# Patient Record
Sex: Male | Born: 1950 | Hispanic: No | Marital: Single | Smoking: Never smoker
Health system: Southern US, Community
[De-identification: ages and names within clinical notes are randomized; demographics above are authoritative.]

## PROBLEM LIST (undated history)

## (undated) DIAGNOSIS — E119 Type 2 diabetes mellitus without complications: Secondary | ICD-10-CM

## (undated) DIAGNOSIS — I10 Essential (primary) hypertension: Secondary | ICD-10-CM

## (undated) HISTORY — PX: KNEE SURGERY: SHX244

---

## 1999-03-24 ENCOUNTER — Encounter: Payer: Self-pay | Admitting: Emergency Medicine

## 1999-03-24 ENCOUNTER — Emergency Department (HOSPITAL_COMMUNITY): Admission: EM | Admit: 1999-03-24 | Discharge: 1999-03-24 | Payer: Self-pay | Admitting: Emergency Medicine

## 2000-07-02 ENCOUNTER — Emergency Department (HOSPITAL_COMMUNITY): Admission: EM | Admit: 2000-07-02 | Discharge: 2000-07-02 | Payer: Self-pay | Admitting: *Deleted

## 2002-04-08 ENCOUNTER — Encounter: Admission: RE | Admit: 2002-04-08 | Discharge: 2002-04-08 | Payer: Self-pay | Admitting: Internal Medicine

## 2002-04-08 ENCOUNTER — Encounter: Payer: Self-pay | Admitting: Internal Medicine

## 2002-04-12 ENCOUNTER — Encounter: Admission: RE | Admit: 2002-04-12 | Discharge: 2002-04-12 | Payer: Self-pay | Admitting: Urology

## 2002-04-12 ENCOUNTER — Encounter: Payer: Self-pay | Admitting: Urology

## 2002-04-21 ENCOUNTER — Encounter: Admission: RE | Admit: 2002-04-21 | Discharge: 2002-04-21 | Payer: Self-pay | Admitting: Urology

## 2002-04-21 ENCOUNTER — Encounter: Payer: Self-pay | Admitting: Urology

## 2002-04-22 ENCOUNTER — Ambulatory Visit (HOSPITAL_BASED_OUTPATIENT_CLINIC_OR_DEPARTMENT_OTHER): Admission: RE | Admit: 2002-04-22 | Discharge: 2002-04-22 | Payer: Self-pay | Admitting: Urology

## 2012-11-18 ENCOUNTER — Ambulatory Visit (HOSPITAL_COMMUNITY)
Admission: RE | Admit: 2012-11-18 | Discharge: 2012-11-18 | Disposition: A | Discharge status: Home / Self Care | Payer: Self-pay | Source: Ambulatory Visit | Attending: Internal Medicine | Admitting: Internal Medicine

## 2012-11-18 ENCOUNTER — Other Ambulatory Visit (HOSPITAL_COMMUNITY): Payer: Self-pay | Admitting: Unknown Physician Specialty

## 2012-11-18 ENCOUNTER — Other Ambulatory Visit (HOSPITAL_COMMUNITY): Payer: Self-pay | Admitting: Internal Medicine

## 2012-11-18 DIAGNOSIS — R609 Edema, unspecified: Secondary | ICD-10-CM

## 2012-11-18 NOTE — Progress Notes (Signed)
*  PRELIMINARY RESULTS* Vascular Ultrasound Left lower extremity venous duplexd has been completed.  Preliminary findings: Left = negative for DVT and baker's cyst.  Bilateral enlarged inguinal lymph nodes noted.  Attempted call report to Dr. Concepcion Elk with no answer. Left voice message with results.   Farrel Demark, RDMS, RVT  11/18/2012, 2:16 PM

## 2017-02-15 ENCOUNTER — Encounter (HOSPITAL_COMMUNITY): Payer: Self-pay | Admitting: Emergency Medicine

## 2017-02-15 ENCOUNTER — Inpatient Hospital Stay (HOSPITAL_COMMUNITY): Payer: Self-pay

## 2017-02-15 ENCOUNTER — Emergency Department (HOSPITAL_COMMUNITY): Payer: Self-pay

## 2017-02-15 ENCOUNTER — Inpatient Hospital Stay (HOSPITAL_COMMUNITY)
Admission: EM | Admit: 2017-02-15 | Discharge: 2017-02-21 | DRG: 194 | Disposition: A | Discharge status: Home / Self Care | Payer: Self-pay | Attending: Family Medicine | Admitting: Family Medicine

## 2017-02-15 ENCOUNTER — Other Ambulatory Visit: Payer: Self-pay

## 2017-02-15 DIAGNOSIS — E119 Type 2 diabetes mellitus without complications: Secondary | ICD-10-CM

## 2017-02-15 DIAGNOSIS — R0609 Other forms of dyspnea: Secondary | ICD-10-CM

## 2017-02-15 DIAGNOSIS — D631 Anemia in chronic kidney disease: Secondary | ICD-10-CM | POA: Diagnosis present

## 2017-02-15 DIAGNOSIS — R7989 Other specified abnormal findings of blood chemistry: Secondary | ICD-10-CM

## 2017-02-15 DIAGNOSIS — R509 Fever, unspecified: Secondary | ICD-10-CM | POA: Diagnosis present

## 2017-02-15 DIAGNOSIS — J189 Pneumonia, unspecified organism: Principal | ICD-10-CM | POA: Diagnosis present

## 2017-02-15 DIAGNOSIS — N179 Acute kidney failure, unspecified: Secondary | ICD-10-CM | POA: Diagnosis present

## 2017-02-15 DIAGNOSIS — E785 Hyperlipidemia, unspecified: Secondary | ICD-10-CM | POA: Diagnosis present

## 2017-02-15 DIAGNOSIS — I358 Other nonrheumatic aortic valve disorders: Secondary | ICD-10-CM | POA: Diagnosis present

## 2017-02-15 DIAGNOSIS — R1013 Epigastric pain: Secondary | ICD-10-CM

## 2017-02-15 DIAGNOSIS — R600 Localized edema: Secondary | ICD-10-CM | POA: Diagnosis present

## 2017-02-15 DIAGNOSIS — I455 Other specified heart block: Secondary | ICD-10-CM | POA: Diagnosis not present

## 2017-02-15 DIAGNOSIS — R0683 Snoring: Secondary | ICD-10-CM | POA: Diagnosis present

## 2017-02-15 DIAGNOSIS — E1122 Type 2 diabetes mellitus with diabetic chronic kidney disease: Secondary | ICD-10-CM | POA: Diagnosis present

## 2017-02-15 DIAGNOSIS — I10 Essential (primary) hypertension: Secondary | ICD-10-CM | POA: Diagnosis present

## 2017-02-15 DIAGNOSIS — N184 Chronic kidney disease, stage 4 (severe): Secondary | ICD-10-CM | POA: Diagnosis present

## 2017-02-15 DIAGNOSIS — J984 Other disorders of lung: Secondary | ICD-10-CM | POA: Diagnosis present

## 2017-02-15 DIAGNOSIS — R109 Unspecified abdominal pain: Secondary | ICD-10-CM | POA: Diagnosis present

## 2017-02-15 DIAGNOSIS — I493 Ventricular premature depolarization: Secondary | ICD-10-CM | POA: Diagnosis not present

## 2017-02-15 DIAGNOSIS — Z7982 Long term (current) use of aspirin: Secondary | ICD-10-CM

## 2017-02-15 DIAGNOSIS — I129 Hypertensive chronic kidney disease with stage 1 through stage 4 chronic kidney disease, or unspecified chronic kidney disease: Secondary | ICD-10-CM | POA: Diagnosis present

## 2017-02-15 DIAGNOSIS — E1121 Type 2 diabetes mellitus with diabetic nephropathy: Secondary | ICD-10-CM

## 2017-02-15 DIAGNOSIS — Z79899 Other long term (current) drug therapy: Secondary | ICD-10-CM

## 2017-02-15 DIAGNOSIS — Z7984 Long term (current) use of oral hypoglycemic drugs: Secondary | ICD-10-CM

## 2017-02-15 HISTORY — DX: Essential (primary) hypertension: I10

## 2017-02-15 HISTORY — DX: Type 2 diabetes mellitus without complications: E11.9

## 2017-02-15 LAB — URINALYSIS, ROUTINE W REFLEX MICROSCOPIC
BACTERIA UA: NONE SEEN
Bilirubin Urine: NEGATIVE
Glucose, UA: NEGATIVE mg/dL
Ketones, ur: NEGATIVE mg/dL
Leukocytes, UA: NEGATIVE
NITRITE: NEGATIVE
Protein, ur: >=300 mg/dL — AB
RBC / HPF: NONE SEEN RBC/hpf (ref 0–5)
Specific Gravity, Urine: 1.015 (ref 1.005–1.030)
pH: 5 (ref 5.0–8.0)

## 2017-02-15 LAB — COMPREHENSIVE METABOLIC PANEL
ALK PHOS: 68 U/L (ref 38–126)
ALT: 18 U/L (ref 17–63)
ANION GAP: 8 (ref 5–15)
AST: 19 U/L (ref 15–41)
Albumin: 3.3 g/dL — ABNORMAL LOW (ref 3.5–5.0)
BILIRUBIN TOTAL: 0.4 mg/dL (ref 0.3–1.2)
BUN: 26 mg/dL — ABNORMAL HIGH (ref 6–20)
CALCIUM: 10.4 mg/dL — AB (ref 8.9–10.3)
CO2: 25 mmol/L (ref 22–32)
Chloride: 107 mmol/L (ref 101–111)
Creatinine, Ser: 2.8 mg/dL — ABNORMAL HIGH (ref 0.61–1.24)
GFR calc non Af Amer: 22 mL/min — ABNORMAL LOW (ref >60)
GFR, EST AFRICAN AMERICAN: 26 mL/min — AB (ref >60)
GLUCOSE: 84 mg/dL (ref 65–99)
Potassium: 4.2 mmol/L (ref 3.5–5.1)
Sodium: 140 mmol/L (ref 135–145)
TOTAL PROTEIN: 6.8 g/dL (ref 6.5–8.1)

## 2017-02-15 LAB — CBG MONITORING, ED: GLUCOSE-CAPILLARY: 108 mg/dL — AB (ref 65–99)

## 2017-02-15 LAB — CBC WITH DIFFERENTIAL/PLATELET
Basophils Absolute: 0 10*3/uL (ref 0.0–0.1)
Basophils Relative: 0 %
Eosinophils Absolute: 0 10*3/uL (ref 0.0–0.7)
Eosinophils Relative: 0 %
HEMATOCRIT: 36.7 % — AB (ref 39.0–52.0)
HEMOGLOBIN: 11.7 g/dL — AB (ref 13.0–17.0)
LYMPHS ABS: 0.8 10*3/uL (ref 0.7–4.0)
Lymphocytes Relative: 14 %
MCH: 28.5 pg (ref 26.0–34.0)
MCHC: 31.9 g/dL (ref 30.0–36.0)
MCV: 89.3 fL (ref 78.0–100.0)
MONOS PCT: 10 %
Monocytes Absolute: 0.6 10*3/uL (ref 0.1–1.0)
NEUTROS ABS: 4.3 10*3/uL (ref 1.7–7.7)
NEUTROS PCT: 76 %
Platelets: 237 10*3/uL (ref 150–400)
RBC: 4.11 MIL/uL — ABNORMAL LOW (ref 4.22–5.81)
RDW: 14.6 % (ref 11.5–15.5)
WBC: 5.7 10*3/uL (ref 4.0–10.5)

## 2017-02-15 LAB — BRAIN NATRIURETIC PEPTIDE: B NATRIURETIC PEPTIDE 5: 12.3 pg/mL (ref 0.0–100.0)

## 2017-02-15 LAB — POCT I-STAT TROPONIN I: TROPONIN I, POC: 0 ng/mL (ref 0.00–0.08)

## 2017-02-15 LAB — LACTIC ACID, PLASMA: Lactic Acid, Venous: 0.6 mmol/L (ref 0.5–1.9)

## 2017-02-15 LAB — D-DIMER, QUANTITATIVE: D-Dimer, Quant: 0.99 ug/mL-FEU — ABNORMAL HIGH (ref 0.00–0.50)

## 2017-02-15 LAB — CG4 I-STAT (LACTIC ACID): LACTIC ACID, VENOUS: 1.95 mmol/L — AB (ref 0.5–1.9)

## 2017-02-15 LAB — GLUCOSE, CAPILLARY: GLUCOSE-CAPILLARY: 112 mg/dL — AB (ref 65–99)

## 2017-02-15 LAB — LIPASE, BLOOD: Lipase: 27 U/L (ref 11–51)

## 2017-02-15 MED ORDER — SODIUM CHLORIDE 0.9 % IV BOLUS (SEPSIS)
1000.0000 mL | Freq: Once | INTRAVENOUS | Status: DC
  Administered 2017-02-15: 1000 mL via INTRAVENOUS

## 2017-02-15 MED ORDER — SIMVASTATIN 10 MG PO TABS
10.0000 mg | ORAL_TABLET | Freq: Every day | ORAL | Status: DC
  Administered 2017-02-15 – 2017-02-20 (×6): 10 mg via ORAL
  Filled 2017-02-15 (×7): qty 1

## 2017-02-15 MED ORDER — SODIUM CHLORIDE 0.9 % IV BOLUS (SEPSIS)
500.0000 mL | Freq: Once | INTRAVENOUS | Status: AC
  Administered 2017-02-15: 500 mL via INTRAVENOUS

## 2017-02-15 MED ORDER — ASPIRIN EC 81 MG PO TBEC
81.0000 mg | DELAYED_RELEASE_TABLET | Freq: Every day | ORAL | Status: DC
  Administered 2017-02-15 – 2017-02-21 (×7): 81 mg via ORAL
  Filled 2017-02-15 (×8): qty 1

## 2017-02-15 MED ORDER — ACETAMINOPHEN 325 MG PO TABS
325.0000 mg | ORAL_TABLET | Freq: Once | ORAL | Status: AC
  Administered 2017-02-15: 325 mg via ORAL
  Filled 2017-02-15: qty 1

## 2017-02-15 MED ORDER — HEPARIN SODIUM (PORCINE) 5000 UNIT/ML IJ SOLN
5000.0000 [IU] | Freq: Three times a day (TID) | INTRAMUSCULAR | Status: DC
  Administered 2017-02-15 – 2017-02-21 (×18): 5000 [IU] via SUBCUTANEOUS
  Filled 2017-02-15 (×18): qty 1

## 2017-02-15 MED ORDER — ONDANSETRON HCL 4 MG/2ML IJ SOLN: 4.0000 mg | Freq: Four times a day (QID) | INTRAMUSCULAR | Status: DC | PRN

## 2017-02-15 MED ORDER — ACETAMINOPHEN 325 MG PO TABS
650.0000 mg | ORAL_TABLET | Freq: Once | ORAL | Status: AC | PRN
  Administered 2017-02-15: 650 mg via ORAL
  Filled 2017-02-15: qty 2

## 2017-02-15 MED ORDER — PANTOPRAZOLE SODIUM 40 MG IV SOLR
40.0000 mg | Freq: Every day | INTRAVENOUS | Status: DC
  Administered 2017-02-15 – 2017-02-20 (×6): 40 mg via INTRAVENOUS
  Filled 2017-02-15 (×7): qty 40

## 2017-02-15 MED ORDER — INSULIN ASPART 100 UNIT/ML ~~LOC~~ SOLN
0.0000 [IU] | Freq: Three times a day (TID) | SUBCUTANEOUS | Status: DC
  Administered 2017-02-16: 2 [IU] via SUBCUTANEOUS
  Administered 2017-02-17: 1 [IU] via SUBCUTANEOUS
  Administered 2017-02-17 – 2017-02-18 (×2): 2 [IU] via SUBCUTANEOUS
  Administered 2017-02-19 (×3): 1 [IU] via SUBCUTANEOUS
  Administered 2017-02-20: 2 [IU] via SUBCUTANEOUS
  Administered 2017-02-21 (×2): 1 [IU] via SUBCUTANEOUS
  Administered 2017-02-21: 2 [IU] via SUBCUTANEOUS

## 2017-02-15 MED ORDER — AMLODIPINE BESYLATE 10 MG PO TABS
10.0000 mg | ORAL_TABLET | Freq: Every day | ORAL | Status: DC
  Administered 2017-02-15 – 2017-02-21 (×7): 10 mg via ORAL
  Filled 2017-02-15 (×7): qty 1

## 2017-02-15 NOTE — Progress Notes (Signed)
Please call floor nurse for report at 2040. Phone [phone].

## 2017-02-15 NOTE — ED Provider Notes (Signed)
[facility]-EMERGENCY DEPT Provider Note   CSN: [id number] Arrival date & time: 02/15/17  1137     History   Chief Complaint Chief Complaint  Patient presents with  . Cough  . Abdominal Pain    HPI Omar Dean is a 66 y.o. male with history of obesity, hypertension, diabetes glimepiride, hyperlipidemia presents the ED with multiple complaints including fever, chills, cough, chest pain, epigastric abdominal pain and exertional shortness of breath for the last several weeks.   Fever onset this morning.  Cough has been going on for over a month, had 2 episodes of posttussive emesis of black/dark blood at the beginning but this has since resolved. Cough is now more dry, nonproductive, persistent. Cough exacerbates central chest pain. Went to [place] recently, but cough began before trip. No exposure to TB that he knows of.  Chest pain is central, nonradiating, nonexertional, intermittent. Exacerbated with cough and sometimes with deep breathing. Ongoing for 2-3 weeks. No orthopnea, PND. Has chronic, occasional LE edema recently unchanged. Denies abdominal swelling. Baseline weight 280-283#.  Epigastric abdominal pain is nonradiating and intermittent. Occasionally worse with standing up straight. Not aggravated by food intake or exertion. Ongoing for 2-3 weeks. No nausea, vomiting, diarrhea, constipation, melena, hematochezia, dysuria.  Exertional dyspnea for the last 1 month. Patient states that walking 5-10 minutes makes him short of breath. Rest typically alleviates shortness of breath. He denies exertional chest pain, palpitations, dizziness.  HPI  Past Medical History:  Diagnosis Date  . Diabetes mellitus without complication (HCC)   . Hypertension     There are no active problems to display for this patient.   Past Surgical History:  Procedure Laterality Date  . KNEE SURGERY Left        Home Medications    Prior to Admission medications   Medication Sig Start  Date End Date Taking? Authorizing Provider  amLODipine (NORVASC) 10 MG tablet Take 10 mg by mouth daily.   Yes [provider]  aspirin EC 81 MG tablet Take 81 mg by mouth daily.   Yes [provider]  furosemide (LASIX) 20 MG tablet Take 20 mg by mouth daily as needed for edema.   Yes [provider]  glimepiride (AMARYL) 4 MG tablet Take 4 mg by mouth daily.   Yes [provider]  losartan-hydrochlorothiazide (HYZAAR) 100-25 MG tablet Take 1 tablet by mouth daily.   Yes [provider]  simvastatin (ZOCOR) 10 MG tablet Take 10 mg by mouth at bedtime.   Yes [provider]    Family History No family history on file.  Social History Social History  Substance Use Topics  . Smoking status: Never Smoker  . Smokeless tobacco: Never Used  . Alcohol use No     Allergies   Patient has no known allergies.   Review of Systems Review of Systems  Constitutional: Positive for chills and fever.  HENT: Negative for postnasal drip, rhinorrhea and sore throat.   Eyes: Negative for visual disturbance.  Respiratory: Positive for cough and shortness of breath. Negative for chest tightness.   Cardiovascular: Positive for chest pain and leg swelling (chronic, unchanged).  Gastrointestinal: Positive for abdominal pain. Negative for anal bleeding, blood in stool, constipation, diarrhea, nausea and vomiting.  Genitourinary: Negative for difficulty urinating, dysuria and hematuria.  Musculoskeletal: Negative for back pain.  Allergic/Immunologic: Positive for immunocompromised state (DM).  Neurological: Negative for syncope, light-headedness and headaches.     Physical Exam Updated Vital Signs BP 128/66 (BP Location:  Right Arm)   Pulse (!) 112   Temp (!) 102.4 F (39.1 C) (Oral)   Resp 18   SpO2 100%   Physical Exam  Constitutional: He appears well-developed and well-nourished.  NAD.  HENT:  Head: Normocephalic and atraumatic.  Nose:  Nose normal.  Moist mucous membranes Tonsils and oropharynx normal  Eyes: Conjunctivae, EOM and lids are normal.  Neck: Trachea normal and normal range of motion.  Neck is supple Trachea midline No cervical adenopathy  Cardiovascular: Regular rhythm, S1 normal and S2 normal.  Tachycardia present.   Murmur heard. Pulses:      Carotid pulses are 2+ on the right side, and 2+ on the left side.      Radial pulses are 2+ on the right side, and 2+ on the left side.       Dorsalis pedis pulses are 2+ on the right side, and 2+ on the left side.       Posterior tibial pulses are 2+ on the right side, and 2+ on the left side.  Tachycardic  Question systolic murmur 1+ symmetric LE edema bilaterally No orthopnea No carotid bruits Calves supple and nontender  Pulmonary/Chest: Effort normal. No respiratory distress. He has decreased breath sounds in the right lower field and the left lower field. He has no wheezes. He has no rhonchi. He has no rales.  No chest wall tenderness CP not reproducible with AROM of upper extremities  Abdominal: Soft. Bowel sounds are normal. There is no tenderness.  No epigastric tenderness, no R/R/G Obese abdomen   Lymphadenopathy:    He has no cervical adenopathy.  Neurological: He is alert. GCS eye subscore is 4. GCS verbal subscore is 5. GCS motor subscore is 6.  Skin: Skin is warm and dry. Capillary refill takes less than 2 seconds.  Psychiatric: He has a normal mood and affect. His speech is normal and behavior is normal. Judgment and thought content normal. Cognition and memory are normal.     ED Treatments / Results  Labs (all labs ordered are listed, but only abnormal results are displayed) Labs Reviewed  CBC WITH DIFFERENTIAL/PLATELET - Abnormal; Notable for the following:       Result Value   RBC 4.11 (*)    Hemoglobin 11.7 (*)    HCT 36.7 (*)    All other components within normal limits  COMPREHENSIVE METABOLIC PANEL - Abnormal; Notable for the  following:    BUN 26 (*)    Creatinine, Ser 2.80 (*)    Calcium 10.4 (*)    Albumin 3.3 (*)    GFR calc non Af Amer 22 (*)    GFR calc Af Amer 26 (*)    All other components within normal limits  URINALYSIS, ROUTINE W REFLEX MICROSCOPIC - Abnormal; Notable for the following:    Hgb urine dipstick SMALL (*)    Protein, ur >=300 (*)    Squamous Epithelial / LPF 0-5 (*)    All other components within normal limits  D-DIMER, QUANTITATIVE (NOT AT [facility]) - Abnormal; Notable for the following:    D-Dimer, Quant 0.99 (*)    All other components within normal limits  CG4 I-STAT (LACTIC ACID) - Abnormal; Notable for the following:    Lactic Acid, Venous 1.95 (*)    All other components within normal limits  CULTURE, BLOOD (ROUTINE X 2)  CULTURE, BLOOD (ROUTINE X 2)  URINE CULTURE  LIPASE, BLOOD  BRAIN NATRIURETIC PEPTIDE  I-STAT CG4 LACTIC ACID, ED  I-STAT  TROPONIN, ED  POCT I-STAT TROPONIN I  I-STAT CG4 LACTIC ACID, ED    EKG  EKG Interpretation None       Radiology Dg Chest 2 View  Result Date: 02/15/2017 CLINICAL DATA:  Cough and fever for 2 months. EXAM: CHEST  2 VIEW COMPARISON:  None. FINDINGS: The cardiac silhouette is enlarged. Mediastinal contours appear intact. There is no evidence of focal airspace consolidation, pleural effusion or pneumothorax. Mild increase of the interstitial markings. Osseous structures are without acute abnormality. Soft tissues are grossly normal. IMPRESSION: Enlarged cardiac silhouette. Mild increase of the interstitial markings usually seen with interstitial pulmonary edema. Electronically Signed   By: Ted Mcalpine M.D.   On: 02/15/2017 13:20    Procedures Procedures (including critical care time)  Medications Ordered in ED Medications  sodium chloride 0.9 % bolus 500 mL (not administered)  acetaminophen (TYLENOL) tablet 650 mg (650 mg Oral Given 02/15/17 1334)  acetaminophen (TYLENOL) tablet 325 mg (325 mg Oral Given 02/15/17 1500)       Initial Impression / Assessment and Plan / ED Course  I have reviewed the triage vital signs and the nursing notes.  Pertinent labs & imaging results that were available during my care of the patient were reviewed by me and considered in my medical decision making (see chart for details).  Clinical Course as of Feb 16 1827  Sat Feb 15, 2017  1553 Lactic Acid 1.95 Troponin 0.00  [CG]  1620 Creatinine: (!) 2.80 [CG]  1621 BUN: (!) 26 [CG]  1647 D-Dimer, Quant: (!) 0.99 [CG]  1752 IMPRESSION: Enlarged cardiac silhouette.  Mild increase of the interstitial markings usually seen with interstitial pulmonary edema. DG Chest 2 View [CG]    Clinical Course User Index [CG] Liberty Handy, PA-C     66 year old male presents to ED with multiple complaints including cough, chest pain, epigastric abdominal pain for several weeks. Also reports new onset exertional dyspnea. Fever, chills onset today. Chest pain sounds more atypical however he has multiple risk factors. His heart score is a least a 3. Recent trip to [place], but no previous history of PE/DVT. Shortness of breath is exertional and not pleuritic.  On initial exam he is tachycardic and febrile, however he does not look toxic. Question systolic murmur. Decreased lung sounds bilateral in lower lobes but no crackles. Non reproducible CP. Abdomen is soft NTND. 1+ symmetrical LE edema.   Lab remarkable for AKI with creatinine 2.80, GFR 26. Lactic acid barely elevated at 1.95. Stable anemia. D-dimer is elevated at 0.99. Chest x-ray shows enlarged cardiac silhouette and mild interstitial pulmonary edema. BNP is normal. EKG unremarkable. Initial troponin negative. Urinalysis without sign of infection. Lipase normal. Blood and urine cultures sent.   Final Clinical Impressions(s) / ED Diagnoses   Question small PNA given fevers, chills, cough but none seen in CXR. Unable to get CT scan given GFR. Unable to do VQ scan in ED. Will request  admission for echo, CP rule out, elevated d-dimer/PE rule out and AKI. Blood and urine cultures sent.   Final diagnoses:  Exertional dyspnea  Elevated d-dimer    New Prescriptions New Prescriptions   No medications on file     Jerrell Mylar 02/15/17 1828    Lavera Guise, MD 02/16/17 1022

## 2017-02-15 NOTE — ED Triage Notes (Signed)
Patient c/o cough and abd pain since June. Patient denies n/v/d. Patient adds that he has swelling in bilat hands.

## 2017-02-15 NOTE — ED Notes (Signed)
Lactic Acid 1.95 Troponin 0.00

## 2017-02-15 NOTE — ED Notes (Signed)
Pt stayed at 100% during ambulation 

## 2017-02-15 NOTE — ED Notes (Signed)
Report attempted at this time.

## 2017-02-15 NOTE — H&P (Signed)
History and Physical  Omar Dean ZOX:[id number] DOB: [birth year] DOA: 02/15/2017  PCP:  Fleet Contras, MD   Chief Complaint:  Weakness Abdominal pain cough  History of Present Illness:  Pt is a 66 yo male with hx of HTN and DMII who came with cc of weakness, epigastric abdominal pain and mild dyspnea on exertion that has been going on for a week along with chills. But he said he actually started feeling "sick" after his visit to [place] last summer ( from July to Sep) with two episodes of hematemesis/hemoptysis in August that he was told it was due to chronic cough which he continues to have. He is febrile in the ED but denied subjective fever/night sweats. No nausea/vomtitng since August. No diarrhea/constipation. Epigastric pain is mild and non radiating and gets worse with breathing. No chest pain. LE edema is mild and he was not aware of it much. No hx of VTE or HF or lung disease. He is not aware of hx of CKD. No other complaints including joints pain/skin rash.   Review of Systems:  CONSTITUTIONAL:     No night sweats.  +fatigue.  No fever. +chills. Eyes:                            No visual changes.  No eye pain.  No eye discharge.   ENT:                              No epistaxis.  No sinus pain.  No sore throat.   No congestion. RESPIRATORY:           +cough.  No wheeze.  No hemoptysis.  +dyspnea CARDIOVASCULAR   :  No chest pains.  No palpitations. GASTROINTESTINAL:  +abdominal pain.  No nausea. No vomiting.  No diarrhea. No   constipation.  No hematemesis.  No hematochezia.  No melena. GENITOURINARY:      No urgency.  No frequency.  No dysuria.  No hematuria.  No   obstructive symptoms.  No discharge.  No pain.   MUSCULOSKELETAL:  No musculoskeletal pain.  No joint swelling.  No arthritis. NEUROLOGICAL:        No confusion.  No weakness. No headache. No seizure. PSYCHIATRIC:             No depression. No anxiety. No suicidal ideation. SKIN:                              No rashes.  No lesions.  No wounds. ENDOCRINE:                No weight loss.  No polydipsia.  No polyuria.  No polyphagia. HEMATOLOGIC:           No purpura.  No petechiae.  No bleeding.  ALLERGIC                 : No pruritus.  No angioedema Other:  Past Medical and Surgical History:   Past Medical History:  Diagnosis Date  . Diabetes mellitus without complication (HCC)   . Hypertension    Past Surgical History:  Procedure Laterality Date  . KNEE SURGERY Left     Social History:   reports that he has never smoked. He has never used smokeless tobacco. He reports that he does not drink alcohol. His  drug history is not on file.    No Known Allergies  No family history on file.    Prior to Admission medications   Medication Sig Start Date End Date Taking? Authorizing Provider  amLODipine (NORVASC) 10 MG tablet Take 10 mg by mouth daily.   Yes [provider]  aspirin EC 81 MG tablet Take 81 mg by mouth daily.   Yes [provider]  furosemide (LASIX) 20 MG tablet Take 20 mg by mouth daily as needed for edema.   Yes [provider]  glimepiride (AMARYL) 4 MG tablet Take 4 mg by mouth daily.   Yes [provider]  losartan-hydrochlorothiazide (HYZAAR) 100-25 MG tablet Take 1 tablet by mouth daily.   Yes [provider]  simvastatin (ZOCOR) 10 MG tablet Take 10 mg by mouth at bedtime.   Yes [provider]    Physical Exam: BP 128/66 (BP Location: Right Arm)   Pulse (!) 112   Temp (!) 102.4 F (39.1 C) (Oral)   Resp 18   SpO2 100%   GENERAL :   Alert and cooperative, and appears to be in no acute distress. HEAD:           normocephalic. EYES:            PERRL, EOMI.   EARS:           hearing grossly intact. NECK:          supple CARDIAC:    Normal S1 and S2. No gallop. No murmurs.  Vascular:     +peripheral edema.  LUNGS:       Clear to auscultation  ABDOMEN: Positive bowel sounds. Soft, nondistended,  nontender. No guarding or rebound.      MSK:           No joint erythema or tenderness.  EXT           : No significant deformity or joint abnormality. Neuro        : Alert, oriented to person, place, and time. SKIN:            No rash. No lesions. PSYCH:       No hallucination. Patient is not suicidal.          Labs on Admission:  Reviewed.   Radiological Exams on Admission: Dg Chest 2 View  Result Date: 02/15/2017 CLINICAL DATA:  Cough and fever for 2 months. EXAM: CHEST  2 VIEW COMPARISON:  None. FINDINGS: The cardiac silhouette is enlarged. Mediastinal contours appear intact. There is no evidence of focal airspace consolidation, pleural effusion or pneumothorax. Mild increase of the interstitial markings. Osseous structures are without acute abnormality. Soft tissues are grossly normal. IMPRESSION: Enlarged cardiac silhouette. Mild increase of the interstitial markings usually seen with interstitial pulmonary edema. Electronically Signed   By: Ted Mcalpine M.D.   On: 02/15/2017 13:20    EKG:  Independently reviewed. Sinus tach  Assessment/Plan  Weakness/fatigue:  Unclear etiology to me, no weight loss/night sweats/loss of appetite/deperssion. Denied significant dyspnea but in setting of exertional dyspnea with LE edema, will r/o new onset HF and check Echo. In setting of chronic cough, will check CT chest w/o contrast.   Chronic cough/dyspnea/ hx of hemoptysis/hematemesis/elevated d dimers; need to r/o PE, will check VQ scan and LE [place].   Fever/chills: possible infectious process esp with hx of travel to [place]. But no subjective fevers / leukocytosis. Will repeat LA. Will check bcx and monitor. Check CT chest.  Epigastric abdominal pain / hx of hematemesis: keep on PPI, may need to r/o PUD/MWS. Will need GI consult as outpatient.   CKD vs AKI: no baseline to compare, will check [place], will hold ARBS, will repeat cr in am.   HTN: cont norvasc  DMII: keep on SS insulin    Input & Output: ordered  Lines & Tubes: PIV DVT prophylaxis: [place] hep GI prophylaxis: PPI Consultants: na  Code Status: Full Family Communication: none at bedside Disposition Plan: TBD    Eston Esters M.D [facility]

## 2017-02-16 ENCOUNTER — Inpatient Hospital Stay (HOSPITAL_COMMUNITY): Payer: Self-pay

## 2017-02-16 ENCOUNTER — Encounter (HOSPITAL_COMMUNITY): Payer: Self-pay

## 2017-02-16 DIAGNOSIS — J189 Pneumonia, unspecified organism: Secondary | ICD-10-CM | POA: Diagnosis present

## 2017-02-16 DIAGNOSIS — R0600 Dyspnea, unspecified: Secondary | ICD-10-CM

## 2017-02-16 LAB — ECHOCARDIOGRAM COMPLETE
CHL CUP DOP CALC LVOT VTI: 19.8 cm
CHL CUP MV DEC (S): 143
CHL CUP RV SYS PRESS: 29 mmHg
E/e' ratio: 13.19
EWDT: 143 ms
FS: 28 % (ref 28–44)
HEIGHTINCHES: 76 in
IV/PV OW: 0.98
LA ID, A-P, ES: 46 mm
LA diam end sys: 46 mm
LA vol index: 28.1 mL/m2
LADIAMINDEX: 1.76 cm/m2
LAVOL: 73.4 mL
LAVOLA4C: 77.5 mL
LV E/e' medial: 13.19
LV TDI E'LATERAL: 9.25
LV e' LATERAL: 9.25 cm/s
LVEEAVG: 13.19
LVOT SV: 82 mL
LVOT area: 4.15 cm2
LVOT peak grad rest: 6 mmHg
LVOT peak vel: 119 cm/s
LVOTD: 23 mm
MV Peak grad: 6 mmHg
MVPKAVEL: 98.8 m/s
MVPKEVEL: 122 m/s
PW: 12.2 mm — AB (ref 0.6–1.1)
RV LATERAL S' VELOCITY: 21.6 cm/s
Reg peak vel: 254 cm/s
TAPSE: 26.2 mm
TDI e' medial: 9.9
TRMAXVEL: 254 cm/s
Weight: 4723.14 oz

## 2017-02-16 LAB — CBC
HEMATOCRIT: 26 % — AB (ref 39.0–52.0)
Hemoglobin: 8.3 g/dL — ABNORMAL LOW (ref 13.0–17.0)
MCH: 28.3 pg (ref 26.0–34.0)
MCHC: 31.9 g/dL (ref 30.0–36.0)
MCV: 88.7 fL (ref 78.0–100.0)
Platelets: 255 10*3/uL (ref 150–400)
RBC: 2.93 MIL/uL — ABNORMAL LOW (ref 4.22–5.81)
RDW: 15 % (ref 11.5–15.5)
WBC: 5.8 10*3/uL (ref 4.0–10.5)

## 2017-02-16 LAB — URINE CULTURE

## 2017-02-16 LAB — COMPREHENSIVE METABOLIC PANEL
ALBUMIN: 2.8 g/dL — AB (ref 3.5–5.0)
ALT: 15 U/L — ABNORMAL LOW (ref 17–63)
AST: 13 U/L — AB (ref 15–41)
Alkaline Phosphatase: 61 U/L (ref 38–126)
Anion gap: 4 — ABNORMAL LOW (ref 5–15)
BILIRUBIN TOTAL: 0.4 mg/dL (ref 0.3–1.2)
BUN: 29 mg/dL — AB (ref 6–20)
CHLORIDE: 111 mmol/L (ref 101–111)
CO2: 27 mmol/L (ref 22–32)
Calcium: 9.9 mg/dL (ref 8.9–10.3)
Creatinine, Ser: 2.78 mg/dL — ABNORMAL HIGH (ref 0.61–1.24)
GFR calc Af Amer: 26 mL/min — ABNORMAL LOW (ref >60)
GFR calc non Af Amer: 23 mL/min — ABNORMAL LOW (ref >60)
GLUCOSE: 95 mg/dL (ref 65–99)
POTASSIUM: 4.3 mmol/L (ref 3.5–5.1)
Sodium: 142 mmol/L (ref 135–145)
Total Protein: 6 g/dL — ABNORMAL LOW (ref 6.5–8.1)

## 2017-02-16 LAB — GLUCOSE, CAPILLARY
GLUCOSE-CAPILLARY: 136 mg/dL — AB (ref 65–99)
Glucose-Capillary: 93 mg/dL (ref 65–99)
Glucose-Capillary: 179 mg/dL — ABNORMAL HIGH (ref 65–99)
Glucose-Capillary: 114 mg/dL — ABNORMAL HIGH (ref 65–99)

## 2017-02-16 MED ORDER — CEFTRIAXONE SODIUM 2 G IJ SOLR
2.0000 g | INTRAMUSCULAR | Status: DC
  Administered 2017-02-16 – 2017-02-17 (×2): 2 g via INTRAVENOUS
  Filled 2017-02-16 (×3): qty 2

## 2017-02-16 MED ORDER — TECHNETIUM TO 99M ALBUMIN AGGREGATED
4.0000 | Freq: Once | INTRAVENOUS | Status: AC | PRN
  Administered 2017-02-16: 4 via INTRAVENOUS

## 2017-02-16 MED ORDER — AZITHROMYCIN 250 MG PO TABS
500.0000 mg | ORAL_TABLET | ORAL | Status: DC
  Administered 2017-02-16 – 2017-02-17 (×2): 500 mg via ORAL
  Filled 2017-02-16 (×2): qty 2

## 2017-02-16 MED ORDER — TECHNETIUM TC 99M DIETHYLENETRIAME-PENTAACETIC ACID
30.1000 | Freq: Once | INTRAVENOUS | Status: AC | PRN
  Administered 2017-02-16: 30.1 via INTRAVENOUS

## 2017-02-16 NOTE — Progress Notes (Signed)
PROGRESS NOTE    Omar Dean  WJX:[id number] DOB: 23-Nov-1950 DOA: 02/15/2017 PCP: Fleet Contras, MD    Brief Narrative:  66 yo male with hx of HTN and DMII who came with cc of weakness, epigastric abdominal pain and mild dyspnea on exertion that has been going on for a week along with chills. But he said he actually started feeling "sick" after his visit to [place] last summer ( from July to Sep) with two episodes of hematemesis/hemoptysis in August that he was told it was due to chronic cough which he continues to have. He is febrile in the ED but denied subjective fever/night sweats.    Assessment & Plan:   Active Problems: Weakness fatigue - work up looking like pneumonia as culprit.  - Will assess echocardiogram results    PNA (pneumonia) - start antibiotic regimen - await culture results.  - fever most likely 2ary to this problem.   ARF vs CKD - unsure what serum creatinine is at baseline.  - has not improved much on recheck and renal U/s reporting chronic kidney disease as findings  DVT prophylaxis: Heparin Code Status: Full Family Communication: d/c family at bedside.  Disposition Plan: pending results from pending work up (echo)   Consultants:   None   Procedures: echo pending   Antimicrobials: azithromycin and rocephin   Subjective: Pt has no new complaints, no acute issues overnight.  Objective: Vitals:   02/15/17 2141 02/16/17 0454 02/16/17 0920 02/16/17 1400  BP: 111/75 123/71 120/77 132/71  Pulse: 92 83  79  Resp: Temp: 99.6 F (37.6 C) 98.8 F (37.1 C)  98.7 F (37.1 C)  TempSrc: Oral Oral  Oral  SpO2: 95% 97%  99%  Weight:  133.9 kg (295 lb 3.1 oz)    Height:  (1.93 m)  (1.93 m)      Intake/Output Summary (Last 24 hours) at 02/16/17 1523 Last data filed at 02/16/17 1300  Gross per 24 hour  Intake             1200 ml  Output                0 ml  Net             1200 ml   Filed Weights   02/16/17  0454  Weight: 133.9 kg (295 lb 3.1 oz)    Examination:  General exam: Appears calm and comfortable, in nad.  Respiratory system: rhales, no wheezes, equal chest rise.  Cardiovascular system: S1 & S2 heard, RRR. No JVD, murmurs, rubs Gastrointestinal system: Abdomen is nondistended, soft and nontender. No organomegaly or masses felt. Normal bowel sounds heard. Central nervous system: Alert and oriented. No focal neurological deficits. Extremities: Symmetric 5 x 5 power. Skin: No rashes, lesions or ulcers, on limited exam. Psychiatry:  Mood & affect appropriate.     Data Reviewed: I have personally reviewed following labs and imaging studies  CBC:  Recent Labs Lab 02/15/17 1518 02/16/17 0500  WBC 5.7 5.8  NEUTROABS 4.3  --   HGB 11.7* 8.3*  HCT 36.7* 26.0*  MCV 89.3 88.7  PLT 237 255   Basic Metabolic Panel:  Recent Labs Lab 02/15/17 1518 02/16/17 0500  NA 140 142  K 4.2 4.3  CL 107 111  CO2 25 27  GLUCOSE 84 95  BUN 26* 29*  CREATININE 2.80* 2.78*  CALCIUM 10.4* 9.9   GFR: Estimated Creatinine Clearance: 40.1 mL/min (A) (by  C-G formula based on SCr of 2.78 mg/dL (H)). Liver Function Tests:  Recent Labs Lab 02/15/17 1518 02/16/17 0500  AST 19 13*  ALT 18 15*  ALKPHOS 68 61  BILITOT 0.4 0.4  PROT 6.8 6.0*  ALBUMIN 3.3* 2.8*    Recent Labs Lab 02/15/17 1518  LIPASE 27   No results for input(s): AMMONIA in the last 168 hours. Coagulation Profile: No results for input(s): INR, PROTIME in the last 168 hours. Cardiac Enzymes: No results for input(s): CKTOTAL, CKMB, CKMBINDEX, TROPONINI in the last 168 hours. BNP (last 3 results) No results for input(s): PROBNP in the last 8760 hours. HbA1C: No results for input(s): HGBA1C in the last 72 hours. CBG:  Recent Labs Lab 02/15/17 2026 02/15/17 2150 02/16/17 0817 02/16/17 1125  GLUCAP 108* 112* 93 179*   Lipid Profile: No results for input(s): CHOL, HDL, LDLCALC, TRIG, CHOLHDL, LDLDIRECT in the  last 72 hours. Thyroid Function Tests: No results for input(s): TSH, T4TOTAL, FREET4, T3FREE, THYROIDAB in the last 72 hours. Anemia Panel: No results for input(s): VITAMINB12, FOLATE, FERRITIN, TIBC, IRON, RETICCTPCT in the last 72 hours. Sepsis Labs:  Recent Labs Lab 02/15/17 1532 02/15/17 2235  LATICACIDVEN 1.95* 0.6    Recent Results (from the past 240 hour(s))  Urine culture     Status: Abnormal   Collection Time: 02/15/17  3:34 PM  Result Value Ref Range Status   Specimen Description URINE, RANDOM  Final   Special Requests NONE  Final   Culture (A)  Final    <10,000 COLONIES/mL INSIGNIFICANT GROWTH Performed at [facility], [address] [place], [place] [address]    Report Status 02/16/2017 FINAL  Final      Radiology Studies: Dg Chest 2 View  Result Date: 02/15/2017 CLINICAL DATA:  Cough and fever for 2 months. EXAM: CHEST  2 VIEW COMPARISON:  None. FINDINGS: The cardiac silhouette is enlarged. Mediastinal contours appear intact. There is no evidence of focal airspace consolidation, pleural effusion or pneumothorax. Mild increase of the interstitial markings. Osseous structures are without acute abnormality. Soft tissues are grossly normal. IMPRESSION: Enlarged cardiac silhouette. Mild increase of the interstitial markings usually seen with interstitial pulmonary edema. Electronically Signed   By: Ted Mcalpine M.D.   On: 02/15/2017 13:20   Ct Chest Wo Contrast  Result Date: 02/15/2017 CLINICAL DATA:  Chronic cough.  Fever. EXAM: CT CHEST WITHOUT CONTRAST TECHNIQUE: Multidetector CT imaging of the chest was performed following the standard protocol without IV contrast. COMPARISON:  Chest x-ray from same day. FINDINGS: Cardiovascular: No significant vascular findings. Normal heart size. No pericardial effusion. Mediastinum/Nodes: No enlarged mediastinal or axillary lymph nodes. Thyroid gland, trachea, and esophagus demonstrate no significant findings.  Lungs/Pleura: There are few scattered peripheral peribronchovascular ground-glass opacities in the right lung. Small focal area of tree-in-bud opacities in the right middle lobe. There is a 6 mm solid nodule in the left lower lobe (series 7, image 75). There are two 5 mm nodules in the right lower lobe (series 7, image 56, image 60). Punctate calcified granuloma in the anterior right upper lobe. No pleural effusion or pneumothorax. Upper Abdomen: No acute abnormality.  Hepatic steatosis. Musculoskeletal: No acute or suspicious osseous findings. IMPRESSION: 1. Few scattered peripheral peribronchovascular ground-glass opacities in the right lung, with small more focal area of tree-in-bud opacities in the right middle lobe. Findings are likely infectious or inflammatory in etiology. 2. Several solid pulmonary nodules in the bilateral lower lobes, measuring up to 6 mm.  Non-contrast chest CT at 3-6 months is recommended. If the nodules are stable at time of repeat CT, then future CT at 18-24 months (from today's scan) is considered optional for low-risk patients, but is recommended for high-risk patients. This recommendation follows the consensus statement: Guidelines for Management of Incidental Pulmonary Nodules Detected on CT Images: From the Fleischner Society 2017; Radiology 2017; 284:228-243. 3. Hepatic steatosis. Electronically Signed   By: Obie Dredge M.D.   On: 02/15/2017 20:12   US Renal  Result Date: 02/15/2017 CLINICAL DATA:  Initial evaluation for acute renal injury. EXAM: RENAL / URINARY TRACT ULTRASOUND COMPLETE COMPARISON:  None available. FINDINGS: Right Kidney: Length: 11.0 cm. Increased echogenicity within the renal parenchymal, suggesting medical renal disease. No mass or hydronephrosis visualized. Left Kidney: Length: 10.7 cm. Increased echogenicity within the renal parenchyma, suggesting medical renal disease. No solid mass or hydronephrosis visualized. 1.0 x 0.7 x 0.5 cm simple cyst noted  at the upper pole left kidney. Bladder: Appears normal for degree of bladder distention. IMPRESSION: 1. Increased echogenicity within the renal parenchyma, compatible with medical renal disease. No hydronephrosis. 2. 1 cm simple left renal cyst. Electronically Signed   By: Rise Mu M.D.   On: 02/15/2017 21:25    Scheduled Meds: . amLODipine  10 mg Oral Daily  . aspirin EC  81 mg Oral Daily  . azithromycin  500 mg Oral Q24H  . heparin  5,000 Units Subcutaneous Q8H  . insulin aspart  0-9 Units Subcutaneous TID WC  . pantoprazole (PROTONIX) IV  40 mg Intravenous QHS  . simvastatin  10 mg Oral QHS   Continuous Infusions: . cefTRIAXone (ROCEPHIN)  IV       LOS: 1 day    Time spent: > 35 minutes  Penny Pia, MD [facility] Pager [phone]  If 7PM-7AM, please contact night-coverage [email or web address] Password [email or web address] 02/16/2017, 3:23 PM

## 2017-02-16 NOTE — Progress Notes (Signed)
  Echocardiogram 2D Echocardiogram has been performed.  Omar Dean 02/16/2017, 11:27 AM

## 2017-02-16 NOTE — Progress Notes (Signed)
PHARMACY NOTE -  Ceftriaxone/Azithromycin  Pharmacy has been consulted to dose Ceftriaxone and Azithromycin for CAP.   Will order Ceftriaxone 2g IV q24h (noted of weight 134 kg) and Azithromycin 500 mg PO q24h x 7 days.  No renal adjustment needed; therefore, pharmacy will sign off since need for further dosage adjustment appears unlikely at present.    Please reconsult if a change in clinical status warrants re-evaluation of dosage.  Thank you for the consult.  Clance Boll, PharmD, BCPS Pager: [phone] 02/16/2017 1:26 PM

## 2017-02-17 ENCOUNTER — Inpatient Hospital Stay (HOSPITAL_COMMUNITY): Payer: Self-pay

## 2017-02-17 DIAGNOSIS — R609 Edema, unspecified: Secondary | ICD-10-CM

## 2017-02-17 LAB — BASIC METABOLIC PANEL
Anion gap: 5 (ref 5–15)
BUN: 25 mg/dL — ABNORMAL HIGH (ref 6–20)
CHLORIDE: 108 mmol/L (ref 101–111)
CO2: 25 mmol/L (ref 22–32)
Calcium: 10.3 mg/dL (ref 8.9–10.3)
Creatinine, Ser: 2.57 mg/dL — ABNORMAL HIGH (ref 0.61–1.24)
GFR calc non Af Amer: 25 mL/min — ABNORMAL LOW (ref >60)
GFR, EST AFRICAN AMERICAN: 29 mL/min — AB (ref >60)
Glucose, Bld: 134 mg/dL — ABNORMAL HIGH (ref 65–99)
Potassium: 4.8 mmol/L (ref 3.5–5.1)
SODIUM: 138 mmol/L (ref 135–145)

## 2017-02-17 LAB — GLUCOSE, CAPILLARY
GLUCOSE-CAPILLARY: 148 mg/dL — AB (ref 65–99)
GLUCOSE-CAPILLARY: 167 mg/dL — AB (ref 65–99)
GLUCOSE-CAPILLARY: 178 mg/dL — AB (ref 65–99)
Glucose-Capillary: 105 mg/dL — ABNORMAL HIGH (ref 65–99)

## 2017-02-17 LAB — MAGNESIUM: MAGNESIUM: 1.8 mg/dL (ref 1.7–2.4)

## 2017-02-17 MED ORDER — GUAIFENESIN-DM 100-10 MG/5ML PO SYRP
5.0000 mL | ORAL_SOLUTION | ORAL | Status: DC | PRN
  Administered 2017-02-17 – 2017-02-20 (×5): 5 mL via ORAL
  Filled 2017-02-17 (×5): qty 10

## 2017-02-17 MED ORDER — BENZONATATE 100 MG PO CAPS
100.0000 mg | ORAL_CAPSULE | Freq: Three times a day (TID) | ORAL | Status: DC | PRN
  Administered 2017-02-17: 100 mg via ORAL
  Filled 2017-02-17 (×2): qty 1

## 2017-02-17 MED ORDER — ACETAMINOPHEN 325 MG PO TABS
650.0000 mg | ORAL_TABLET | Freq: Four times a day (QID) | ORAL | Status: DC | PRN
  Administered 2017-02-17 – 2017-02-21 (×5): 650 mg via ORAL
  Filled 2017-02-17 (×5): qty 2

## 2017-02-17 NOTE — Progress Notes (Signed)
Charge nurse alerted nurse of call from telemetry reporting patients pulse getting into 40s and patient had a 2.2 second pause. Nurse paged Dr. Antionette Char, hospitalist on-call, to make provider aware.

## 2017-02-17 NOTE — Progress Notes (Signed)
VASCULAR LAB PRELIMINARY  PRELIMINARY  PRELIMINARY  PRELIMINARY  Bilateral lower extremity venous duplex completed.    Preliminary report:  Bilateral:  No evidence of DVT, superficial thrombosis, or Baker's Cyst.   Rosamaria Donn, RVS 02/17/2017, 11:23 AM

## 2017-02-17 NOTE — Progress Notes (Signed)
PROGRESS NOTE    Jeanpaul Biehl  XBJ:[id number] DOB: 25-Apr-1951 DOA: 02/15/2017 PCP: Fleet Contras, MD    Brief Narrative:  66 yo male with hx of HTN and DMII who came with cc of weakness, epigastric abdominal pain and mild dyspnea on exertion that has been going on for a week along with chills. But he said he actually started feeling "sick" after his visit to [place] last summer ( from July to Sep) with two episodes of hematemesis/hemoptysis in August that he was told it was due to chronic cough which he continues to have.   He has been febrile today  Assessment & Plan:   Active Problems: Weakness fatigue - Most likely secondary to pneumonia - echo results reported below. EF of 60-65%    PNA (pneumonia) - start antibiotic regimen - await culture results.  - fever most likely 2ary to this problem.   ARF vs CKD - unsure what serum creatinine is at baseline.  - has not improved much on recheck and renal U/s reporting chronic kidney disease as findings - Serum creatinine stable at 2.5  DVT prophylaxis: Heparin Code Status: Full Family Communication: d/c family at bedside.  Disposition Plan: pending resolution of fevers.    Consultants:   None   Procedures: echo reporting normal ef 60-65% with no regional wall motion abnormalities and normal diastolic function parameters   Antimicrobials: azithromycin and rocephin   Subjective: Reports feeling febrile, no new complaints otherwise.   Objective: Vitals:   02/17/17 0640 02/17/17 0901 02/17/17 1319 02/17/17 1436  BP:  126/85 125/63   Pulse:   (!) 118   Resp:   18   Temp: 99.2 F (37.3 C)  (!) 103.1 F (39.5 C) (!) 102.9 F (39.4 C)  TempSrc: Oral  Oral Oral  SpO2:   94%   Weight:      Height:        Intake/Output Summary (Last 24 hours) at 02/17/17 1601 Last data filed at 02/17/17 1300  Gross per 24 hour  Intake              770 ml  Output                0 ml  Net              770 ml   Filed  Weights   02/16/17 0454  Weight: 133.9 kg (295 lb 3.1 oz)    Examination: unchanged when compared to 02/16/17  General exam: Appears calm and comfortable, in nad.  Respiratory system: rhales, no wheezes, equal chest rise.  Cardiovascular system: S1 & S2 heard, RRR. No JVD, murmurs, rubs Gastrointestinal system: Abdomen is nondistended, soft and nontender. No organomegaly or masses felt. Normal bowel sounds heard. Central nervous system: Alert and oriented. No focal neurological deficits. Extremities: Symmetric 5 x 5 power. Skin: No rashes, lesions or ulcers, on limited exam. Psychiatry:  Mood & affect appropriate.     Data Reviewed: I have personally reviewed following labs and imaging studies  CBC:  Recent Labs Lab 02/15/17 1518 02/16/17 0500  WBC 5.7 5.8  NEUTROABS 4.3  --   HGB 11.7* 8.3*  HCT 36.7* 26.0*  MCV 89.3 88.7  PLT 237 255   Basic Metabolic Panel:  Recent Labs Lab 02/15/17 1518 02/16/17 0500 02/17/17 0512  NA 140 142 138  K 4.2 4.3 4.8  CL 107 111 108  CO2 GLUCOSE 84 95 134*  BUN 26* 29* 25*  CREATININE 2.80* 2.78* 2.57*  CALCIUM 10.4* 9.9 10.3  MG  --   --  1.8   GFR: Estimated Creatinine Clearance: 43.4 mL/min (A) (by C-G formula based on SCr of 2.57 mg/dL (H)). Liver Function Tests:  Recent Labs Lab 02/15/17 1518 02/16/17 0500  AST 19 13*  ALT 18 15*  ALKPHOS 68 61  BILITOT 0.4 0.4  PROT 6.8 6.0*  ALBUMIN 3.3* 2.8*    Recent Labs Lab 02/15/17 1518  LIPASE 27   No results for input(s): AMMONIA in the last 168 hours. Coagulation Profile: No results for input(s): INR, PROTIME in the last 168 hours. Cardiac Enzymes: No results for input(s): CKTOTAL, CKMB, CKMBINDEX, TROPONINI in the last 168 hours. BNP (last 3 results) No results for input(s): PROBNP in the last 8760 hours. HbA1C: No results for input(s): HGBA1C in the last 72 hours. CBG:  Recent Labs Lab 02/16/17 1125 02/16/17 1633 02/16/17 2232  02/17/17 0738 02/17/17 1201  GLUCAP 179* 114* 136* 148* 105*   Lipid Profile: No results for input(s): CHOL, HDL, LDLCALC, TRIG, CHOLHDL, LDLDIRECT in the last 72 hours. Thyroid Function Tests: No results for input(s): TSH, T4TOTAL, FREET4, T3FREE, THYROIDAB in the last 72 hours. Anemia Panel: No results for input(s): VITAMINB12, FOLATE, FERRITIN, TIBC, IRON, RETICCTPCT in the last 72 hours. Sepsis Labs:  Recent Labs Lab 02/15/17 1532 02/15/17 2235  LATICACIDVEN 1.95* 0.6    Recent Results (from the past 240 hour(s))  Urine culture     Status: Abnormal   Collection Time: 02/15/17  3:34 PM  Result Value Ref Range Status   Specimen Description URINE, RANDOM  Final   Special Requests NONE  Final   Culture (A)  Final    <10,000 COLONIES/mL INSIGNIFICANT GROWTH Performed at [facility], [address], [place], [place] [address]    Report Status 02/16/2017 FINAL  Final  Blood Culture (routine x 2)     Status: None (Preliminary result)   Collection Time: 02/15/17  3:45 PM  Result Value Ref Range Status   Specimen Description BLOOD BLOOD RIGHT FOREARM  Final   Special Requests   Final    BOTTLES DRAWN AEROBIC AND ANAEROBIC Blood Culture adequate volume   Culture   Final    NO GROWTH 2 DAYS Performed at [facility], [address], [place], [place] [address]    Report Status PENDING  Incomplete  Blood Culture (routine x 2)     Status: None (Preliminary result)   Collection Time: 02/15/17  3:52 PM  Result Value Ref Range Status   Specimen Description BLOOD LEFT ANTECUBITAL  Final   Special Requests   Final    BOTTLES DRAWN AEROBIC AND ANAEROBIC Blood Culture adequate volume   Culture   Final    NO GROWTH 2 DAYS Performed at [facility], [address] [place], [place] [address]    Report Status PENDING  Incomplete      Radiology Studies: Ct Chest Wo Contrast  Result Date: 02/15/2017 CLINICAL DATA:  Chronic cough.  Fever. EXAM: CT CHEST WITHOUT  CONTRAST TECHNIQUE: Multidetector CT imaging of the chest was performed following the standard protocol without IV contrast. COMPARISON:  Chest x-ray from same day. FINDINGS: Cardiovascular: No significant vascular findings. Normal heart size. No pericardial effusion. Mediastinum/Nodes: No enlarged mediastinal or axillary lymph nodes. Thyroid gland, trachea, and esophagus demonstrate no significant findings. Lungs/Pleura: There are few scattered peripheral peribronchovascular ground-glass opacities in the right lung. Small focal area of tree-in-bud  opacities in the right middle lobe. There is a 6 mm solid nodule in the left lower lobe (series 7, image 75). There are two 5 mm nodules in the right lower lobe (series 7, image 56, image 60). Punctate calcified granuloma in the anterior right upper lobe. No pleural effusion or pneumothorax. Upper Abdomen: No acute abnormality.  Hepatic steatosis. Musculoskeletal: No acute or suspicious osseous findings. IMPRESSION: 1. Few scattered peripheral peribronchovascular ground-glass opacities in the right lung, with small more focal area of tree-in-bud opacities in the right middle lobe. Findings are likely infectious or inflammatory in etiology. 2. Several solid pulmonary nodules in the bilateral lower lobes, measuring up to 6 mm. Non-contrast chest CT at 3-6 months is recommended. If the nodules are stable at time of repeat CT, then future CT at 18-24 months (from today's scan) is considered optional for low-risk patients, but is recommended for high-risk patients. This recommendation follows the consensus statement: Guidelines for Management of Incidental Pulmonary Nodules Detected on CT Images: From the Fleischner Society 2017; Radiology 2017; 284:228-243. 3. Hepatic steatosis. Electronically Signed   By: Obie Dredge M.D.   On: 02/15/2017 20:12   US Renal  Result Date: 02/15/2017 CLINICAL DATA:  Initial evaluation for acute renal injury. EXAM: RENAL / URINARY TRACT  ULTRASOUND COMPLETE COMPARISON:  None available. FINDINGS: Right Kidney: Length: 11.0 cm. Increased echogenicity within the renal parenchymal, suggesting medical renal disease. No mass or hydronephrosis visualized. Left Kidney: Length: 10.7 cm. Increased echogenicity within the renal parenchyma, suggesting medical renal disease. No solid mass or hydronephrosis visualized. 1.0 x 0.7 x 0.5 cm simple cyst noted at the upper pole left kidney. Bladder: Appears normal for degree of bladder distention. IMPRESSION: 1. Increased echogenicity within the renal parenchyma, compatible with medical renal disease. No hydronephrosis. 2. 1 cm simple left renal cyst. Electronically Signed   By: Rise Mu M.D.   On: 02/15/2017 21:25   Nm Pulmonary Perf And Vent  Result Date: 02/16/2017 CLINICAL DATA:  66 year old male with shortness of breath, cough for 2 months. EXAM: NUCLEAR MEDICINE VENTILATION - PERFUSION LUNG SCAN TECHNIQUE: Ventilation images were obtained in multiple projections using inhaled aerosol Tc-21m DTPA. Perfusion images were obtained in multiple projections after intravenous injection of Tc-39m MAA. RADIOPHARMACEUTICALS:  30.1 mCi Technetium-54m DTPA aerosol inhalation and 4.0 mCi Technetium-46m MAA IV COMPARISON:  Chest CT and radiographs 02/15/2017. FINDINGS: Ventilation: Mild artifactual clumping of radiotracer about the central airways. Some gastric contamination. No definite ventilation defect. Perfusion: Homogeneous.  No defect. IMPRESSION: Negative.  No perfusion defect to suggest pulmonary embolus. Electronically Signed   By: Odessa Fleming M.D.   On: 02/16/2017 15:52    Scheduled Meds: . amLODipine  10 mg Oral Daily  . aspirin EC  81 mg Oral Daily  . azithromycin  500 mg Oral Q24H  . heparin  5,000 Units Subcutaneous Q8H  . insulin aspart  0-9 Units Subcutaneous TID WC  . pantoprazole (PROTONIX) IV  40 mg Intravenous QHS  . simvastatin  10 mg Oral QHS   Continuous Infusions: .  cefTRIAXone (ROCEPHIN)  IV Stopped (02/17/17 1452)     LOS: 2 days    Time spent: > 35 minutes  Penny Pia, MD [facility] Pager [phone]  If 7PM-7AM, please contact night-coverage [email or web address] Password [email or web address] 02/17/2017, 4:01 PM

## 2017-02-18 ENCOUNTER — Encounter (HOSPITAL_COMMUNITY): Payer: Self-pay | Admitting: *Deleted

## 2017-02-18 LAB — GLUCOSE, CAPILLARY
GLUCOSE-CAPILLARY: 180 mg/dL — AB (ref 65–99)
Glucose-Capillary: 118 mg/dL — ABNORMAL HIGH (ref 65–99)
Glucose-Capillary: 147 mg/dL — ABNORMAL HIGH (ref 65–99)

## 2017-02-18 MED ORDER — LEVOFLOXACIN IN D5W 750 MG/150ML IV SOLN
750.0000 mg | INTRAVENOUS | Status: DC
  Administered 2017-02-18 – 2017-02-20 (×2): 750 mg via INTRAVENOUS
  Filled 2017-02-18 (×2): qty 150

## 2017-02-18 NOTE — Progress Notes (Signed)
Pharmacy Antibiotic Note  Omar Dean is a 66 y.o. male admitted on 02/15/2017 with pneumonia.  Pharmacy has been consulted for Levaquin dosing.  Plan: Levaquin  IV q48h for CrCl < 50 ml/min Continue to monitor renal function and adjust as needed  Height:  (193 cm) Weight: 295 lb 3.1 oz (133.9 kg) (bed re-zeroed) IBW/kg (Calculated) : 86.8  Temp (24hrs), Avg:101.4 F (38.6 C), Min:98.3 F (36.8 C), Max:103.1 F (39.5 C)   Recent Labs Lab 02/15/17 1518 02/15/17 1532 02/15/17 2235 02/16/17 0500 02/17/17 0512  WBC 5.7  --   --  5.8  --   CREATININE 2.80*  --   --  2.78* 2.57*  LATICACIDVEN  --  1.95* 0.6  --   --     Estimated Creatinine Clearance: 43.4 mL/min (A) (by C-G formula based on SCr of 2.57 mg/dL (H)).    No Known Allergies  Antimicrobials this admission: 10/14 Rocephin >> 10/16 10/14 Zithromax >> 10/16 10/16 Levaquin >>  Dose adjustments this admission:  Microbiology results: 10/13 BCx: ngtd 10/13 UCx: < 10k colonies insignificant growth  Thank you for allowing pharmacy to be a part of this patient's care.  Loralee Pacas, PharmD, BCPS Pager: [phone] 02/18/2017 12:51 PM

## 2017-02-18 NOTE — Progress Notes (Signed)
PROGRESS NOTE    Omar Dean  ZOX:[id number] DOB: 1951/03/02 DOA: 02/15/2017 PCP: Fleet Contras, MD    Brief Narrative:  66 yo male with hx of HTN and DMII who came with cc of weakness, epigastric abdominal pain and mild dyspnea on exertion that has been going on for a week along with chills. But he said he actually started feeling "sick" after his visit to [place] last summer ( from July to Sep) with two episodes of hematemesis/hemoptysis in August that he was told it was due to chronic cough which he continues to have.   He has been febrile today  Assessment & Plan:   Active Problems: Weakness fatigue - Most likely secondary to pneumonia - echo results reported below. EF of 60-65%    PNA (pneumonia) - persistent fevers will change azithromycin and Rocephin to Levaquin today - await culture results.  - fever most likely 2ary to this problem.   ARF vs CKD - unsure what serum creatinine is at baseline.  - has not improved much on recheck and renal U/s reporting chronic kidney disease as findings - Serum creatinine stable at 2.5  DVT prophylaxis: Heparin Code Status: Full Family Communication: d/c family at bedside on 0 ml   Filed Weights   02/16/17 0454  Weight: 133.9 kg (295 lb 3.1 oz)    Examination: unchanged when compared to 02/17/17  General exam: Appears calm and comfortable, in nad.  Respiratory system: rhales, no wheezes, equal chest rise.  Cardiovascular system: S1 & S2 heard, RRR. No JVD, murmurs, rubs Gastrointestinal system: Abdomen is nondistended, soft and nontender. No organomegaly or masses felt. Normal bowel sounds heard. Central nervous system: Alert and oriented. No focal neurological deficits. Extremities: Symmetric 5 x 5 power. Skin: No rashes, lesions or ulcers, on limited exam. Psychiatry:  Mood & affect appropriate.     Data Reviewed: I have personally reviewed following labs and imaging studies  CBC:  Recent Labs Lab 02/15/17 1518 02/16/17 0500  WBC 5.7 5.8  NEUTROABS 4.3  --   HGB 11.7* 8.3*  HCT 36.7* 26.0*  MCV 89.3 88.7  PLT 237 255   Basic Metabolic Panel:  Recent Labs Lab 02/15/17 1518 02/16/17 0500 02/17/17 0512  NA 140 142 138  K 4.2 4.3 4.8  CL 107 111 108  CO2 GLUCOSE 84 95 134*  BUN 26* 29* 25*  CREATININE 2.80* 2.78* 2.57*  CALCIUM 10.4* 9.9 10.3  MG  --   --  1.8   GFR: Estimated Creatinine Clearance: 43.4 mL/min (A) (by C-G formula based on SCr of 2.57 mg/dL (H)). Liver Function Tests:  Recent Labs Lab 02/15/17 1518 02/16/17 0500  AST 19 13*  ALT 18 15*  ALKPHOS 68 61  BILITOT 0.4 0.4  PROT 6.8 6.0*  ALBUMIN 3.3* 2.8*    Recent Labs Lab 02/15/17 1518  LIPASE 27   No results for input(s): AMMONIA in the last 168 hours. Coagulation Profile: No results for input(s): INR, PROTIME in the last 168 hours. Cardiac Enzymes: No results for input(s): CKTOTAL, CKMB, CKMBINDEX, TROPONINI in the last 168 hours. BNP (last 3 results) No results for input(s): PROBNP in the last 8760 hours. HbA1C: No results for input(s):  HGBA1C in the last 72 hours. CBG:  Recent Labs Lab 02/17/17 0738 02/17/17 1201 02/17/17 1706 02/17/17 2137 02/18/17 1131  GLUCAP 148* 105* 167* 178* 180*   Lipid Profile: No results for input(s): CHOL, HDL, LDLCALC, TRIG, CHOLHDL, LDLDIRECT in the last 72 hours. Thyroid Function Tests: No results for input(s): TSH, T4TOTAL, FREET4, T3FREE, THYROIDAB in the last 72 hours. Anemia Panel: No results for input(s): VITAMINB12, FOLATE, FERRITIN, TIBC, IRON, RETICCTPCT in the last 72 hours. Sepsis Labs:  Recent Labs Lab 02/15/17 1532 02/15/17 2235  LATICACIDVEN 1.95* 0.6    Recent Results (from the past 240 hour(s))  Urine culture     Status: Abnormal   Collection Time: 02/15/17  3:34 PM  Result Value Ref Range Status   Specimen Description URINE, RANDOM  Final   Special Requests NONE  Final   Culture (A)  Final    <10,000 COLONIES/mL INSIGNIFICANT GROWTH Performed at [facility], [address], [place], [place] [address]    Report Status 02/16/2017 FINAL  Final  Blood Culture (routine x 2)     Status: None (Preliminary result)   Collection Time: 02/15/17  3:45 PM  Result Value Ref Range Status   Specimen Description BLOOD BLOOD RIGHT FOREARM  Final   Special Requests   Final    BOTTLES DRAWN AEROBIC AND ANAEROBIC Blood Culture adequate volume   Culture   Final    NO GROWTH 2 DAYS Performed at [facility], [address], [place], [place] [address]    Report Status PENDING  Incomplete  Blood Culture (routine x 2)     Status: None (Preliminary result)   Collection Time: 02/15/17  3:52 PM  Result Value Ref Range Status   Specimen Description BLOOD LEFT ANTECUBITAL  Final   Special Requests   Final    BOTTLES DRAWN AEROBIC AND ANAEROBIC Blood Culture adequate volume   Culture   Final    NO GROWTH 2 DAYS Performed at [facility], [address], [place], [place] [address]    Report Status PENDING  Incomplete      Radiology Studies: Nm  Pulmonary Perf And Vent  Result Date: 02/16/2017 CLINICAL DATA:  66 year old male with shortness of breath, cough for 2 months. EXAM: NUCLEAR MEDICINE VENTILATION - PERFUSION LUNG SCAN TECHNIQUE: Ventilation images were obtained in multiple projections using inhaled aerosol Tc-48m DTPA. Perfusion images were obtained in multiple projections after intravenous injection of Tc-15m MAA. RADIOPHARMACEUTICALS:  30.1 mCi Technetium-68m DTPA aerosol inhalation and 4.0 mCi Technetium-66m MAA IV COMPARISON:  Chest CT and  radiographs 02/15/2017. FINDINGS: Ventilation: Mild artifactual clumping of radiotracer about the central airways. Some gastric contamination. No definite ventilation defect. Perfusion: Homogeneous.  No defect. IMPRESSION: Negative.  No perfusion defect to suggest pulmonary embolus. Electronically Signed   By: Odessa Fleming M.D.   On: 02/16/2017 15:52    Scheduled Meds: . amLODipine  10 mg Oral Daily  . aspirin EC  81 mg Oral Daily  . heparin  5,000 Units Subcutaneous Q8H  . insulin aspart  0-9 Units Subcutaneous TID WC  . pantoprazole (PROTONIX) IV  40 mg Intravenous QHS  . simvastatin  10 mg Oral QHS   Continuous Infusions: . levofloxacin (LEVAQUIN) IV 750 mg (02/18/17 1452)     LOS: 3 days    Time spent: > 35 minutes  Penny Pia, MD [facility] Pager [phone]  If 7PM-7AM, please contact night-coverage [email or web address] Password [email or web address] 02/18/2017, 3:04 PM

## 2017-02-19 ENCOUNTER — Encounter (HOSPITAL_COMMUNITY): Payer: Self-pay | Admitting: Oncology

## 2017-02-19 DIAGNOSIS — J189 Pneumonia, unspecified organism: Principal | ICD-10-CM

## 2017-02-19 DIAGNOSIS — E1122 Type 2 diabetes mellitus with diabetic chronic kidney disease: Secondary | ICD-10-CM

## 2017-02-19 DIAGNOSIS — I10 Essential (primary) hypertension: Secondary | ICD-10-CM

## 2017-02-19 DIAGNOSIS — R509 Fever, unspecified: Secondary | ICD-10-CM | POA: Diagnosis present

## 2017-02-19 DIAGNOSIS — E1121 Type 2 diabetes mellitus with diabetic nephropathy: Secondary | ICD-10-CM

## 2017-02-19 DIAGNOSIS — E119 Type 2 diabetes mellitus without complications: Secondary | ICD-10-CM

## 2017-02-19 DIAGNOSIS — R109 Unspecified abdominal pain: Secondary | ICD-10-CM

## 2017-02-19 LAB — CBC WITH DIFFERENTIAL/PLATELET
BASOS ABS: 0 10*3/uL (ref 0.0–0.1)
Basophils Relative: 0 %
EOS ABS: 0 10*3/uL (ref 0.0–0.7)
Eosinophils Relative: 0 %
HCT: 28.7 % — ABNORMAL LOW (ref 39.0–52.0)
HEMOGLOBIN: 8.9 g/dL — AB (ref 13.0–17.0)
LYMPHS ABS: 0.9 10*3/uL (ref 0.7–4.0)
LYMPHS PCT: 13 %
MCH: 27.4 pg (ref 26.0–34.0)
MCHC: 31 g/dL (ref 30.0–36.0)
MCV: 88.3 fL (ref 78.0–100.0)
Monocytes Absolute: 1 10*3/uL (ref 0.1–1.0)
Monocytes Relative: 14 %
NEUTROS PCT: 73 %
Neutro Abs: 5 10*3/uL (ref 1.7–7.7)
PLATELETS: 288 10*3/uL (ref 150–400)
RBC: 3.25 MIL/uL — AB (ref 4.22–5.81)
RDW: 15.3 % (ref 11.5–15.5)
WBC: 6.8 10*3/uL (ref 4.0–10.5)

## 2017-02-19 LAB — BASIC METABOLIC PANEL
ANION GAP: 5 (ref 5–15)
BUN: 25 mg/dL — ABNORMAL HIGH (ref 6–20)
CHLORIDE: 105 mmol/L (ref 101–111)
CO2: 26 mmol/L (ref 22–32)
Calcium: 10.6 mg/dL — ABNORMAL HIGH (ref 8.9–10.3)
Creatinine, Ser: 2.77 mg/dL — ABNORMAL HIGH (ref 0.61–1.24)
GFR calc non Af Amer: 23 mL/min — ABNORMAL LOW (ref >60)
GFR, EST AFRICAN AMERICAN: 26 mL/min — AB (ref >60)
GLUCOSE: 147 mg/dL — AB (ref 65–99)
POTASSIUM: 4.6 mmol/L (ref 3.5–5.1)
Sodium: 136 mmol/L (ref 135–145)

## 2017-02-19 LAB — CBC
HEMATOCRIT: 29.3 % — AB (ref 39.0–52.0)
HEMOGLOBIN: 9.2 g/dL — AB (ref 13.0–17.0)
MCH: 27.8 pg (ref 26.0–34.0)
MCHC: 31.4 g/dL (ref 30.0–36.0)
MCV: 88.5 fL (ref 78.0–100.0)
Platelets: 252 10*3/uL (ref 150–400)
RBC: 3.31 MIL/uL — AB (ref 4.22–5.81)
RDW: 15.3 % (ref 11.5–15.5)
WBC: 5.6 10*3/uL (ref 4.0–10.5)

## 2017-02-19 LAB — GLUCOSE, CAPILLARY
GLUCOSE-CAPILLARY: 127 mg/dL — AB (ref 65–99)
GLUCOSE-CAPILLARY: 182 mg/dL — AB (ref 65–99)
Glucose-Capillary: 141 mg/dL — ABNORMAL HIGH (ref 65–99)
Glucose-Capillary: 175 mg/dL — ABNORMAL HIGH (ref 65–99)

## 2017-02-19 LAB — HEMOGLOBIN A1C
Hgb A1c MFr Bld: 5.4 % (ref 4.8–5.6)
MEAN PLASMA GLUCOSE: 108.28 mg/dL

## 2017-02-19 NOTE — Consult Note (Addendum)
[name] for Infectious Disease    Date of Admission:  02/15/2017   Total days of antibiotics 4 levaquin               Reason for Consult: fever, pneumonia    Referring Provider: Bonner Puna   Assessment: Fever Pulmonary nodules Foreign travel ARF vs CRF DM2  Plan: 1. Isolate for AFB 2. Check sputum AFB 3. Check HIV 4. Check malaria smear 5. Check A1C  Comment- His hx seems to suggest TB.  I have asked lab to look at his blood smear. Malaria seems less likely given that he has been home for several weeks.  He'll need renal f/u at some point (non-urgent).   Thank you so much for this interesting consult,  Active Problems:   Abdominal pain   PNA (pneumonia)   . amLODipine  10 mg Oral Daily  . aspirin EC  81 mg Oral Daily  . heparin  5,000 Units Subcutaneous Q8H  . insulin aspart  0-9 Units Subcutaneous TID WC  . pantoprazole (PROTONIX) IV  40 mg Intravenous QHS  . simvastatin  10 mg Oral QHS    HPI: Omar Dean is a 66 y.o. male who came to [place] from [place] 1990. DM2, on rx > 10  Years. He returned to [place] July-September of this year and had worsening of a chronic cough, and 2 episodes of hemoptysis.  He came to ED on 10-13 with weakness, cough and abd pain. He was adm after being found to have temp 102.4, CXR showing: Enlarged cardiac silhouette. Mild increase of the interstitial markings usually seen with interstitial pulmonary edema. His WBC was normal. He was also noted to have Cr 2.80.  He also had CT chest: 1. Few scattered peripheral peribronchovascular ground-glass opacities in the right lung, with small more focal area of tree-in-bud opacities in the right middle lobe. Findings are likely infectious or inflammatory in etiology. 2. Several solid pulmonary nodules in the bilateral lower lobes, measuring up to 6 mm. Non-contrast chest CT at 3-6 months is recommended.  3. Hepatic steatosis.  On 10-14 he was started on  ceftriaxone/azithro.  He had further fever on 10-15 (103.1) and then again on 10-16. His anbx were changed to leaquin. Further fever today  His BCx are ngtd.   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   Review of Systems: Review of Systems  Constitutional: Positive for chills, fever and malaise/fatigue. Negative for weight loss.  HENT: Negative for sore throat.   Eyes: Negative for blurred vision.  Respiratory: Positive for cough and hemoptysis. Negative for sputum production.   Cardiovascular: Negative for chest pain.  Gastrointestinal: Negative for abdominal pain, constipation and diarrhea.  Genitourinary: Negative for dysuria.  Neurological: Negative for sensory change.  no LAN Please see HPI. All other systems reviewed and negative.   Past Medical History:  Diagnosis Date  . Diabetes mellitus without complication ([place])   . Hypertension     Social History  Substance Use Topics  . Smoking status: Never Smoker  . Smokeless tobacco: Never Used  . Alcohol use No    No family history on file.   Medications:  Scheduled: . amLODipine  10 mg Oral Daily  . aspirin EC  81 mg Oral Daily  . heparin  5,000 Units Subcutaneous Q8H  . insulin aspart  0-9 Units Subcutaneous TID WC  . pantoprazole (PROTONIX) IV  40 mg Intravenous QHS  .  simvastatin  10 mg Oral QHS    Abtx:  Anti-infectives    Start     Dose/Rate Route Frequency Ordered Stop   02/18/17 1400  levofloxacin (LEVAQUIN) IVPB 750 mg     750 mg 100 mL/hr over 90 Minutes Intravenous Every 48 hours 02/18/17 1251     02/16/17 1400  cefTRIAXone (ROCEPHIN) 2 g in dextrose 5 % 50 mL IVPB  Status:  Discontinued     2 g 100 mL/hr over 30 Minutes Intravenous Every 24 hours 02/16/17 1328 02/18/17 1204   02/16/17 1400  azithromycin (ZITHROMAX) tablet 500 mg  Status:  Discontinued     500 mg Oral Every 24 hours 02/16/17 1328 02/18/17 1204        OBJECTIVE: Blood pressure (!) 110/58, pulse 90,  temperature (!) 101.7 F (38.7 C), temperature source Oral, resp. rate 18, height _0  (1.93 m), weight 133.9 kg (295 lb 3.1 oz), SpO2 100 %.  Physical Exam  Constitutional: He is well-developed, well-nourished, and in no distress. No distress.  HENT:  Mouth/Throat: No oropharyngeal exudate.  Eyes: Pupils are equal, round, and reactive to light. EOM are normal.  Neck: Neck supple.  Cardiovascular: Normal rate, regular rhythm and normal heart sounds.   Pulmonary/Chest: Effort normal. No accessory muscle usage. No tachypnea. He has decreased breath sounds.  Abdominal: Soft. Bowel sounds are normal. There is no tenderness. There is no rebound.  Musculoskeletal: He exhibits edema.  Lymphadenopathy:    He has no cervical adenopathy.    He has no axillary adenopathy.  Skin: Skin is warm and dry. He is not diaphoretic.     Psychiatric: Mood normal.    Lab Results Results for orders placed or performed during the hospital encounter of 02/15/17 (from the past 48 hour(s))  Glucose, capillary     Status: Abnormal   Collection Time: 02/17/17  9:37 PM  Result Value Ref Range   Glucose-Capillary 178 (H) 65 - 99 mg/dL  Glucose, capillary     Status: Abnormal   Collection Time: 02/18/17 11:31 AM  Result Value Ref Range   Glucose-Capillary 180 (H) 65 - 99 mg/dL  Glucose, capillary     Status: Abnormal   Collection Time: 02/18/17  5:50 PM  Result Value Ref Range   Glucose-Capillary 118 (H) 65 - 99 mg/dL  Glucose, capillary     Status: Abnormal   Collection Time: 02/18/17 10:41 PM  Result Value Ref Range   Glucose-Capillary 147 (H) 65 - 99 mg/dL  Glucose, capillary     Status: Abnormal   Collection Time: 02/19/17  8:04 AM  Result Value Ref Range   Glucose-Capillary 141 (H) 65 - 99 mg/dL  Glucose, capillary     Status: Abnormal   Collection Time: 02/19/17 12:21 PM  Result Value Ref Range   Glucose-Capillary 175 (H) 65 - 99 mg/dL  CBC     Status: Abnormal   Collection Time: 02/19/17  12:49 PM  Result Value Ref Range   WBC 5.6 4.0 - 10.5 K/uL   RBC 3.31 (L) 4.22 - 5.81 MIL/uL   Hemoglobin 9.2 (L) 13.0 - 17.0 g/dL   HCT 29.3 (L) 39.0 - 52.0 %   MCV 88.5 78.0 - 100.0 fL   MCH 27.8 26.0 - 34.0 pg   MCHC 31.4 30.0 - 36.0 g/dL   RDW 15.3 11.5 - 15.5 %   Platelets 252 150 - 400 K/uL  Basic metabolic panel     Status: Abnormal   Collection Time: 02/19/17  12:49 PM  Result Value Ref Range   Sodium 136 135 - 145 mmol/L   Potassium 4.6 3.5 - 5.1 mmol/L   Chloride 105 101 - 111 mmol/L   CO2 26 22 - 32 mmol/L   Glucose, Bld 147 (H) 65 - 99 mg/dL   BUN 25 (H) 6 - 20 mg/dL   Creatinine, Ser 2.77 (H) 0.61 - 1.24 mg/dL   Calcium 10.6 (H) 8.9 - 10.3 mg/dL   GFR calc non Af Amer 23 (L) >60 mL/min   GFR calc Af Amer 26 (L) >60 mL/min    Comment: (NOTE) The eGFR has been calculated using the CKD EPI equation. This calculation has not been validated in all clinical situations. eGFR's persistently <60 mL/min signify possible Chronic Kidney Disease.    Anion gap 5 5 - 15  Glucose, capillary     Status: Abnormal   Collection Time: 02/19/17  4:36 PM  Result Value Ref Range   Glucose-Capillary 127 (H) 65 - 99 mg/dL      Component Value Date/Time   SDES BLOOD LEFT ANTECUBITAL 02/15/2017 1552   SPECREQUEST  02/15/2017 1552    BOTTLES DRAWN AEROBIC AND ANAEROBIC Blood Culture adequate volume   CULT  02/15/2017 1552    NO GROWTH 4 DAYS Performed at [facility], [place] [address] [place], [place] [address]    REPTSTATUS PENDING 02/15/2017 1552   No results found. Recent Results (from the past 240 hour(s))  Urine culture     Status: Abnormal   Collection Time: 02/15/17  3:34 PM  Result Value Ref Range Status   Specimen Description URINE, RANDOM  Final   Special Requests NONE  Final   Culture (A)  Final    <10,000 COLONIES/mL INSIGNIFICANT GROWTH Performed at [facility], [address] [place], [place] [address]    Report Status 02/16/2017 FINAL  Final    Blood Culture (routine x 2)     Status: None (Preliminary result)   Collection Time: 02/15/17  3:45 PM  Result Value Ref Range Status   Specimen Description BLOOD BLOOD RIGHT FOREARM  Final   Special Requests   Final    BOTTLES DRAWN AEROBIC AND ANAEROBIC Blood Culture adequate volume   Culture   Final    NO GROWTH 4 DAYS Performed at [facility], [place] [address] [place], [place] [address]    Report Status PENDING  Incomplete  Blood Culture (routine x 2)     Status: None (Preliminary result)   Collection Time: 02/15/17  3:52 PM  Result Value Ref Range Status   Specimen Description BLOOD LEFT ANTECUBITAL  Final   Special Requests   Final    BOTTLES DRAWN AEROBIC AND ANAEROBIC Blood Culture adequate volume   Culture   Final    NO GROWTH 4 DAYS Performed at [facility], [place] [address] [place], [place] [address]    Report Status PENDING  Incomplete    Microbiology: Recent Results (from the past 240 hour(s))  Urine culture     Status: Abnormal   Collection Time: 02/15/17  3:34 PM  Result Value Ref Range Status   Specimen Description URINE, RANDOM  Final   Special Requests NONE  Final   Culture (A)  Final    <10,000 COLONIES/mL INSIGNIFICANT GROWTH Performed at [facility], [place] [address] [place], [place] [address]    Report Status 02/16/2017 FINAL  Final  Blood Culture (routine x 2)  Status: None (Preliminary result)   Collection Time: 02/15/17  3:45 PM  Result Value Ref Range Status   Specimen Description BLOOD BLOOD RIGHT FOREARM  Final   Special Requests   Final    BOTTLES DRAWN AEROBIC AND ANAEROBIC Blood Culture adequate volume   Culture   Final    NO GROWTH 4 DAYS Performed at [facility], [address], [place], [place] [address]    Report Status PENDING  Incomplete  Blood Culture (routine x 2)     Status: None (Preliminary result)   Collection Time: 02/15/17  3:52 PM  Result Value Ref Range Status   Specimen Description BLOOD  LEFT ANTECUBITAL  Final   Special Requests   Final    BOTTLES DRAWN AEROBIC AND ANAEROBIC Blood Culture adequate volume   Culture   Final    NO GROWTH 4 DAYS Performed at [facility], [place] [address], [place],  [address]    Report Status PENDING  Incomplete    Bobby Rumpf, MD [facility] for [facility]ious [place] [facility] [phone] 02/19/2017, 5:08 PM

## 2017-02-19 NOTE — Progress Notes (Addendum)
PROGRESS NOTE  Omar Dean  ZOX:[id number] DOB: 06/11/1950 DOA: 02/15/2017 PCP: Fleet Contras, MD   Brief Narrative: Omar Dean is a 66 y.o. male originally from [place] with a history of HTN and T2DM who presented to the ED with about 1 week of weakness, dyspnea on exertion and fevers. He states he had become generally "sick" since returning from [place] in September and has a chronic cough with occasional episodes of hemoptysis, but came in for these acute symptoms. He denies symptoms of weight loss, night sweats, or known exposures. He was febrile with CT chest showing GGOs suspicious for infectious or inflammatory etiologies. Ceftriaxone and azithromycin were started for pneumonia and he was admitted. Creatinine elevation was noted with unknown chronicity. Mild LE edema was noted, though had negative U/S for DVT and no cardiac dysfunction on echocardiogram. Cultures have been negative to date, though antibiotics were switched to levaquin due to persistent fevers which have continued despite this change. ID is consulted for recommendations.   Assessment & Plan: Principal Problem:   Fever Active Problems:   Abdominal pain   PNA (pneumonia)   Benign essential HTN   T2DM (type 2 diabetes mellitus) (HCC)  Fevers, weakness, dyspnea: Suspected to be due to pneumonia found on CT chest, but has continued despite targeted abx. No hypoxia, and VQ scan low risk. - Continue to monitor blood cultures.  - Continue levaquin (q48h dose given 10/16) - Will check HIV  Stage IV CKD: Presumptive Dx with stability of SCr with CrCl 15-61ml/min and longstanding history of HTN, DM.  U/S demonstrated increased echogenicity consistent with chronic medical renal disease.  - Renally dose medications - Monitor SCr, UOP - Avoid nephrotoxins, holding HCTZ/ARB. - Control underlying etiologies. - Would benefit from outpatient nephrology referral.   Essential HTN:  - Continue norvasc - Holding  ARB/HCTZ for now  T2DM:  - Update HbA1c.  - SSI - Hold OSU - Continue ASA, statin.   DVT prophylaxis: Subcutaneous heparin Code Status: Full Family Communication: None at bedside Disposition Plan: Anticipate DC home once work up completed and consistently afebrile.   Consultants:   ID  Procedures:   Echocardiogram 10/14: - Left ventricle: The cavity size was normal. Wall thickness was   increased in a pattern of mild LVH. Systolic function was normal.   The estimated ejection fraction was in the range of 60% to 65%.   Wall motion was normal; there were no regional wall motion   abnormalities. Left ventricular diastolic function parameters   were normal. - Aortic valve: Sclerosis without stenosis. There was no   regurgitation. - Left atrium: The atrium was normal in size. - Tricuspid valve: There was trivial regurgitation. - Pulmonary arteries: PA peak pressure: 29 mm Hg (S). - Inferior vena cava: The vessel was normal in size. The   respirophasic diameter changes were in the normal range (>= 50%),   consistent with normal central venous pressure.  Impressions: - LVEF 60-65%, mild LVH, normal wall motion, normal diastolic   function, normal LA size, trivial TR, RVSP 29 mmHg, normal IVC.  Antimicrobials:  Ceftriaxone, azithromycin 10/14, 10/15  Levaquin 10/16 >>   Subjective: Recurrent fever and chills this PM. Otherwise wants to go home. Still having cough, though this is chronic. No chest pain or urinary symptoms or wounds. Denies rashes.   Objective: Vitals:   02/18/17 1340 02/18/17 2243 02/19/17 0528 02/19/17 1405  BP: 127/80 (!) 128/59 109/64 (!) 110/58  Pulse: 86 93 96 90  Resp:  18 20 18 18   Temp: 98.4 F (36.9 C) (!) 100.8 F (38.2 C) 98.4 F (36.9 C) (!) 101.7 F (38.7 C)  TempSrc: Oral Oral Oral Oral  SpO2: 98% 94% 100% 100%  Weight:      Height:        Intake/Output Summary (Last 24 hours) at 02/19/17 1736 Last data filed at 02/19/17 1200   Gross per 24 hour  Intake              480 ml  Output                2 ml  Net              478 ml   Filed Weights   02/16/17 0454  Weight: 133.9 kg (295 lb 3.1 oz)    Gen: 66 y.o. male in no distress  Pulm: Non-labored breathing room air. Clear to auscultation bilaterally.  CV: Regular rate and rhythm. No murmur, rub, or gallop. No JVD, trace pedal edema. GI: Abdomen soft, non-tender, non-distended, with normoactive bowel sounds. No organomegaly or masses felt. Ext: Warm, no deformities Skin: No rashes, or open wounds/ulcers Neuro: Alert and oriented. No focal neurological deficits. Psych: Judgement and insight appear normal. Mood & affect appropriate.   Data Reviewed: I have personally reviewed following labs and imaging studies  CBC:  Recent Labs Lab 02/15/17 1518 02/16/17 0500 02/19/17 1249  WBC 5.7 5.8 5.6  NEUTROABS 4.3  --   --   HGB 11.7* 8.3* 9.2*  HCT 36.7* 26.0* 29.3*  MCV 89.3 88.7 88.5  PLT 237 255 252   Basic Metabolic Panel:  Recent Labs Lab 02/15/17 1518 02/16/17 0500 02/17/17 0512 02/19/17 1249  NA 140 142 138 136  K 4.2 4.3 4.8 4.6  CL 107 111 108 105  CO2 25 27 25 26   GLUCOSE 84 95 134* 147*  BUN 26* 29* 25* 25*  CREATININE 2.80* 2.78* 2.57* 2.77*  CALCIUM 10.4* 9.9 10.3 10.6*  MG  --   --  1.8  --    GFR: Estimated Creatinine Clearance: 40.2 mL/min (A) (by C-G formula based on SCr of 2.77 mg/dL (H)). Liver Function Tests:  Recent Labs Lab 02/15/17 1518 02/16/17 0500  AST 19 13*  ALT 18 15*  ALKPHOS 68 61  BILITOT 0.4 0.4  PROT 6.8 6.0*  ALBUMIN 3.3* 2.8*    Recent Labs Lab 02/15/17 1518  LIPASE 27   No results for input(s): AMMONIA in the last 168 hours. Coagulation Profile: No results for input(s): INR, PROTIME in the last 168 hours. Cardiac Enzymes: No results for input(s): CKTOTAL, CKMB, CKMBINDEX, TROPONINI in the last 168 hours. BNP (last 3 results) No results for input(s): PROBNP in the last 8760  hours. HbA1C: No results for input(s): HGBA1C in the last 72 hours. CBG:  Recent Labs Lab 02/18/17 1750 02/18/17 2241 02/19/17 0804 02/19/17 1221 02/19/17 1636  GLUCAP 118* 147* 141* 175* 127*   Lipid Profile: No results for input(s): CHOL, HDL, LDLCALC, TRIG, CHOLHDL, LDLDIRECT in the last 72 hours. Thyroid Function Tests: No results for input(s): TSH, T4TOTAL, FREET4, T3FREE, THYROIDAB in the last 72 hours. Anemia Panel: No results for input(s): VITAMINB12, FOLATE, FERRITIN, TIBC, IRON, RETICCTPCT in the last 72 hours. Urine analysis:    Component Value Date/Time   COLORURINE YELLOW 02/15/2017 1543   APPEARANCEUR CLEAR 02/15/2017 1543   LABSPEC 1.015 02/15/2017 1543   PHURINE 5.0 02/15/2017 1543   GLUCOSEU NEGATIVE 02/15/2017 1543  HGBUR SMALL (A) 02/15/2017 1543   BILIRUBINUR NEGATIVE 02/15/2017 1543   KETONESUR NEGATIVE 02/15/2017 1543   PROTEINUR >=300 (A) 02/15/2017 1543   NITRITE NEGATIVE 02/15/2017 1543   LEUKOCYTESUR NEGATIVE 02/15/2017 1543   Recent Results (from the past 240 hour(s))  Urine culture     Status: Abnormal   Collection Time: 02/15/17  3:34 PM  Result Value Ref Range Status   Specimen Description URINE, RANDOM  Final   Special Requests NONE  Final   Culture (A)  Final    <10,000 COLONIES/mL INSIGNIFICANT GROWTH Performed at [facility], [address] [place], [place] [address]    Report Status 02/16/2017 FINAL  Final  Blood Culture (routine x 2)     Status: None (Preliminary result)   Collection Time: 02/15/17  3:45 PM  Result Value Ref Range Status   Specimen Description BLOOD BLOOD RIGHT FOREARM  Final   Special Requests   Final    BOTTLES DRAWN AEROBIC AND ANAEROBIC Blood Culture adequate volume   Culture   Final    NO GROWTH 4 DAYS Performed at [facility], [address] [place], [place] [address]    Report Status PENDING  Incomplete  Blood Culture (routine x 2)     Status: None (Preliminary result)   Collection  Time: 02/15/17  3:52 PM  Result Value Ref Range Status   Specimen Description BLOOD LEFT ANTECUBITAL  Final   Special Requests   Final    BOTTLES DRAWN AEROBIC AND ANAEROBIC Blood Culture adequate volume   Culture   Final    NO GROWTH 4 DAYS Performed at [facility], [address] [place], [place] [address]    Report Status PENDING  Incomplete      Radiology Studies: No results found.  Scheduled Meds: . amLODipine  10 mg Oral Daily  . aspirin EC  81 mg Oral Daily  . heparin  5,000 Units Subcutaneous Q8H  . insulin aspart  0-9 Units Subcutaneous TID WC  . pantoprazole (PROTONIX) IV  40 mg Intravenous QHS  . simvastatin  10 mg Oral QHS   Continuous Infusions: . levofloxacin (LEVAQUIN) IV Stopped (02/18/17 1622)     LOS: 4 days   Time spent: 25 minutes.  Hazeline Junkeryan Grunz, MD [facility] Pager [phone]  If 7PM-7AM, please contact night-coverage [email or web address] Password [email or web address] 02/19/2017, 5:36 PM

## 2017-02-20 ENCOUNTER — Encounter (HOSPITAL_COMMUNITY): Payer: Self-pay

## 2017-02-20 DIAGNOSIS — I455 Other specified heart block: Secondary | ICD-10-CM | POA: Diagnosis not present

## 2017-02-20 DIAGNOSIS — R001 Bradycardia, unspecified: Secondary | ICD-10-CM

## 2017-02-20 DIAGNOSIS — N184 Chronic kidney disease, stage 4 (severe): Secondary | ICD-10-CM

## 2017-02-20 DIAGNOSIS — N179 Acute kidney failure, unspecified: Secondary | ICD-10-CM

## 2017-02-20 DIAGNOSIS — R29818 Other symptoms and signs involving the nervous system: Secondary | ICD-10-CM

## 2017-02-20 LAB — CULTURE, BLOOD (ROUTINE X 2)
CULTURE: NO GROWTH
CULTURE: NO GROWTH
SPECIAL REQUESTS: ADEQUATE
Special Requests: ADEQUATE

## 2017-02-20 LAB — GLUCOSE, CAPILLARY
GLUCOSE-CAPILLARY: 117 mg/dL — AB (ref 65–99)
GLUCOSE-CAPILLARY: 161 mg/dL — AB (ref 65–99)
GLUCOSE-CAPILLARY: 119 mg/dL — AB (ref 65–99)
Glucose-Capillary: 147 mg/dL — ABNORMAL HIGH (ref 65–99)

## 2017-02-20 LAB — HIV-1 RNA QUANT-NO REFLEX-BLD
HIV 1 RNA Quant: <20 copies/mL
LOG10 HIV-1 RNA: UNDETERMINED log10copy/mL

## 2017-02-20 LAB — CREATININE, SERUM
CREATININE: 2.92 mg/dL — AB (ref 0.61–1.24)
GFR calc Af Amer: 25 mL/min — ABNORMAL LOW (ref >60)
GFR, EST NON AFRICAN AMERICAN: 21 mL/min — AB (ref >60)

## 2017-02-20 LAB — PARASITE EXAM SCREEN, BLOOD-W CONF TO LABCORP (NOT @ ARMC)

## 2017-02-20 MED ORDER — SENNOSIDES-DOCUSATE SODIUM 8.6-50 MG PO TABS
1.0000 | ORAL_TABLET | Freq: Every day | ORAL | Status: DC
  Administered 2017-02-20 – 2017-02-21 (×2): 1 via ORAL
  Filled 2017-02-20 (×2): qty 1

## 2017-02-20 MED ORDER — BISACODYL 10 MG RE SUPP: 10.0000 mg | Freq: Every day | RECTAL | Status: DC | PRN

## 2017-02-20 MED ORDER — POLYETHYLENE GLYCOL 3350 17 G PO PACK
17.0000 g | PACK | Freq: Every day | ORAL | Status: DC
  Administered 2017-02-20 – 2017-02-21 (×2): 17 g via ORAL
  Filled 2017-02-20 (×2): qty 1

## 2017-02-20 NOTE — Consult Note (Signed)
CONSULTATION NOTE   Patient Name: Omar Dean Date of Encounter: 02/20/2017 Cardiologist: None (NEW)  Chief Complaint   "I want to go home"  Patient Profile   66 year old [place] male with a history of hypertension and type 2 diabetes who presents with epigastric abdominal pain, mild dyspnea on exertion and chills. Presentation concerning for sepsis. He is demonstrated to have long sinus pauses up to 4 seconds for which cardiology was consulted.  HPI   Omar Dean is a 66 y.o. male who is being seen today for the evaluation of Sinus pause at the request of Dr. Bonner Puna. This is a 66 year old [place] male with a history of hypertension, type 2 diabetes and recent travel to [place] this past summer. He presented with epigastric abdominal pain, tachycardia, dyspnea and fever/chills. He's been seen by infectious diseases who feel that his history may be consistent with tuberculosis. It's also felt that malaria is less likely. CT of the chest shows several solid pulmonary nodules including few scattered. Peripheral peri-bronchovascular groundglass opacities, suspected of infectious or inflammatory etiology. VQ scan was performed which is negative for pulmonary embolus. An echocardiogram was performed on the 02/16/2017 which showed an LVEF of 60-65% with normal systolic and diastolic function. There is aortic valve sclerosis without regurgitation and normal left atrial size. I personally reviewed the patient's telemetry which indicated a low heart rate of 35 at approximately 6:30 AM this morning with an interspersed PVC and compensatory pause. He reported being asleep at the time and was asymptomatic.he does report a history of snoring, and waking with gasping for breath along with some daytime fatigue. He works as a Administrator. Currently he is completely asymptomatic. He was talking on the cell phone when I walked in the room and still remains on TB isolation. He wants to go home  but is waiting for his AFB cultures.  PMHx   Past Medical History:  Diagnosis Date  . Diabetes mellitus without complication ([place])   . Hypertension     Past Surgical History:  Procedure Laterality Date  . KNEE SURGERY Left     FAMHx   No family history on file. No contributory heart disease in the family.  SOCHx    reports that he has never smoked. He has never used smokeless tobacco. He reports that he does not drink alcohol. His drug history is not on file.  Outpatient Medications   No current facility-administered medications on file prior to encounter.    No current outpatient prescriptions on file prior to encounter.    Inpatient Medications    Scheduled Meds: . amLODipine  10 mg Oral Daily  . aspirin EC  81 mg Oral Daily  . heparin  5,000 Units Subcutaneous Q8H  . insulin aspart  0-9 Units Subcutaneous TID WC  . pantoprazole (PROTONIX) IV  40 mg Intravenous QHS  . polyethylene glycol  17 g Oral Daily  . senna-docusate  1 tablet Oral Daily  . simvastatin  10 mg Oral QHS    Continuous Infusions: . levofloxacin (LEVAQUIN) IV Stopped (02/20/17 1356)    PRN Meds: acetaminophen, benzonatate, bisacodyl, guaiFENesin-dextromethorphan, ondansetron (ZOFRAN) IV   ALLERGIES   No Known Allergies  ROS   Pertinent items noted in HPI and remainder of comprehensive ROS otherwise negative.  Vitals   Vitals:   02/19/17 2044 02/19/17 2231 02/20/17 0703 02/20/17 1318  BP: 108/61  (!) 117/94 116/68  Pulse: 89  88 87  Resp: _0 Temp: Marland Kitchen)  102 F (38.9 C) 99.9 F (37.7 C) 98 F (36.7 C) 98.6 F (37 C)  TempSrc: Oral Oral Oral Oral  SpO2: 99%  100% 100%  Weight:      Height:        Intake/Output Summary (Last 24 hours) at 02/20/17 1459 Last data filed at 02/20/17 1226  Gross per 24 hour  Intake              390 ml  Output                0 ml  Net              390 ml   Filed Weights   02/16/17 0454  Weight: 295 lb 3.1 oz (133.9 kg)    Physical  Exam   General appearance: alert and no distress Neck: no carotid bruit, no JVD and thyroid not enlarged, symmetric, no tenderness/mass/nodules Lungs: diminished breath sounds bibasilar Heart: regular rate and rhythm Abdomen: soft, non-tender; bowel sounds normal; no masses,  no organomegaly Extremities: extremities normal, atraumatic, no cyanosis or edema Pulses: 2+ and symmetric Skin: Skin color, texture, turgor normal. No rashes or lesions Neurologic: Grossly normal psych: Pleasant  Labs   Results for orders placed or performed during the hospital encounter of 02/15/17 (from the past 48 hour(s))  Glucose, capillary     Status: Abnormal   Collection Time: 02/18/17  5:50 PM  Result Value Ref Range   Glucose-Capillary 118 (H) 65 - 99 mg/dL  Glucose, capillary     Status: Abnormal   Collection Time: 02/18/17 10:41 PM  Result Value Ref Range   Glucose-Capillary 147 (H) 65 - 99 mg/dL  Glucose, capillary     Status: Abnormal   Collection Time: 02/19/17  8:04 AM  Result Value Ref Range   Glucose-Capillary 141 (H) 65 - 99 mg/dL  Glucose, capillary     Status: Abnormal   Collection Time: 02/19/17 12:21 PM  Result Value Ref Range   Glucose-Capillary 175 (H) 65 - 99 mg/dL  CBC     Status: Abnormal   Collection Time: 02/19/17 12:49 PM  Result Value Ref Range   WBC 5.6 4.0 - 10.5 K/uL   RBC 3.31 (L) 4.22 - 5.81 MIL/uL   Hemoglobin 9.2 (L) 13.0 - 17.0 g/dL   HCT 29.3 (L) 39.0 - 52.0 %   MCV 88.5 78.0 - 100.0 fL   MCH 27.8 26.0 - 34.0 pg   MCHC 31.4 30.0 - 36.0 g/dL   RDW 15.3 11.5 - 15.5 %   Platelets 252 150 - 400 K/uL  Basic metabolic panel     Status: Abnormal   Collection Time: 02/19/17 12:49 PM  Result Value Ref Range   Sodium 136 135 - 145 mmol/L   Potassium 4.6 3.5 - 5.1 mmol/L   Chloride 105 101 - 111 mmol/L   CO2 26 22 - 32 mmol/L   Glucose, Bld 147 (H) 65 - 99 mg/dL   BUN 25 (H) 6 - 20 mg/dL   Creatinine, Ser 2.77 (H) 0.61 - 1.24 mg/dL   Calcium 10.6 (H) 8.9 - 10.3  mg/dL   GFR calc non Af Amer 23 (L) >60 mL/min   GFR calc Af Amer 26 (L) >60 mL/min    Comment: (NOTE) The eGFR has been calculated using the CKD EPI equation. This calculation has not been validated in all clinical situations. eGFR's persistently <60 mL/min signify possible Chronic Kidney Disease.    Anion gap 5 5 -  15  Glucose, capillary     Status: Abnormal   Collection Time: 02/19/17  4:36 PM  Result Value Ref Range   Glucose-Capillary 127 (H) 65 - 99 mg/dL  Hemoglobin A1c     Status: None   Collection Time: 02/19/17  5:42 PM  Result Value Ref Range   Hgb A1c MFr Bld 5.4 4.8 - 5.6 %    Comment: (NOTE) Pre diabetes:          5.7%-6.4% Diabetes:              >6.4% Glycemic control for   <7.0% adults with diabetes    Mean Plasma Glucose 108.28 mg/dL    Comment: Performed at [facility] [address] [place], [place] [address]  CBC with Differential/Platelet     Status: Abnormal   Collection Time: 02/19/17  5:42 PM  Result Value Ref Range   WBC 6.8 4.0 - 10.5 K/uL   RBC 3.25 (L) 4.22 - 5.81 MIL/uL   Hemoglobin 8.9 (L) 13.0 - 17.0 g/dL   HCT 28.7 (L) 39.0 - 52.0 %   MCV 88.3 78.0 - 100.0 fL   MCH 27.4 26.0 - 34.0 pg   MCHC 31.0 30.0 - 36.0 g/dL   RDW 15.3 11.5 - 15.5 %   Platelets 288 150 - 400 K/uL   Neutrophils Relative % 73 %   Neutro Abs 5.0 1.7 - 7.7 K/uL   Lymphocytes Relative 13 %   Lymphs Abs 0.9 0.7 - 4.0 K/uL   Monocytes Relative 14 %   Monocytes Absolute 1.0 0.1 - 1.0 K/uL   Eosinophils Relative 0 %   Eosinophils Absolute 0.0 0.0 - 0.7 K/uL   Basophils Relative 0 %   Basophils Absolute 0.0 0.0 - 0.1 K/uL  Glucose, capillary     Status: Abnormal   Collection Time: 02/19/17  8:37 PM  Result Value Ref Range   Glucose-Capillary 182 (H) 65 - 99 mg/dL  Parasite Exam Screen, Blood-w conf to [facility] (Not @ [facility])     Status: None   Collection Time: 02/19/17  9:38 PM  Result Value Ref Range   Parasite Exam Screen, Blood            Comment: No  Plasmodium or other Blood Parasites seen on thin smears. Sent to Reference Laboratory for final review of thick and thin smears. RESULT CALLED TO, READ BACK BY AND VERIFIED WITH: S.ADAMS,RN [id number] 02/20/17 [email or web address] Performed at [facility], [address], [place], [place] [address]   Creatinine, serum     Status: Abnormal   Collection Time: 02/20/17  5:14 AM  Result Value Ref Range   Creatinine, Ser 2.92 (H) 0.61 - 1.24 mg/dL   GFR calc non Af Amer 21 (L) >60 mL/min   GFR calc Af Amer 25 (L) >60 mL/min    Comment: (NOTE) The eGFR has been calculated using the CKD EPI equation. This calculation has not been validated in all clinical situations. eGFR's persistently <60 mL/min signify possible Chronic Kidney Disease.   Glucose, capillary     Status: Abnormal   Collection Time: 02/20/17  9:16 AM  Result Value Ref Range   Glucose-Capillary 117 (H) 65 - 99 mg/dL   Comment 1 Notify RN   Glucose, capillary     Status: Abnormal   Collection Time: 02/20/17 11:57 AM  Result Value Ref Range   Glucose-Capillary 161 (H) 65 - 99 mg/dL   Comment 1 Notify RN  ECG   Sinus tach at 116, possible left atrial enlargement, lateral T-wave changes - Personally Reviewed  Telemetry   Sinus bradycardia at 35 with a PVC and compensatory pause - Personally Reviewed  Radiology   No results found.  Cardiac Studies   LV EF: 60% -   65%  ------------------------------------------------------------------- Indications:      Dyspnea 786.09.  ------------------------------------------------------------------- Study Conclusions  - Left ventricle: The cavity size was normal. Wall thickness was   increased in a pattern of mild LVH. Systolic function was normal.   The estimated ejection fraction was in the range of 60% to 65%.   Wall motion was normal; there were no regional wall motion   abnormalities. Left ventricular diastolic function parameters   were normal. - Aortic valve:  Sclerosis without stenosis. There was no   regurgitation. - Left atrium: The atrium was normal in size. - Tricuspid valve: There was trivial regurgitation. - Pulmonary arteries: PA peak pressure: 29 mm Hg (S). - Inferior vena cava: The vessel was normal in size. The   respirophasic diameter changes were in the normal range (>= 50%),   consistent with normal central venous pressure.  Impressions:  - LVEF 60-65%, mild LVH, normal wall motion, normal diastolic   function, normal LA size, trivial TR, RVSP 29 mmHg, normal IVC.  Impression   Principal Problem:   Fever Active Problems:   Abdominal pain   PNA (pneumonia)   Benign essential HTN   T2DM (type 2 diabetes mellitus) (HCC)   Sinus pause   Recommendation   1. Mr. Wardlow had asymptomatic sinus bradycardia with a PVC and compensatory pause early this morning while asleep, which is concerning for possible obstructive sleep apnea. He reports daytime fatigue, non-restorative sleep, snoring and personal apneic episodes for which he wakes up coughing and choking. As he is hospitalized, I would recommend an overnight oximetry study. He should also have a formal outpatient sleep study. His echocardiogram which I personally reviewed 4 days ago appears normal and is reassuring.  No further cardiac recommendations at this time.Thanks for the consultation. Cardiology will sign off.  Time Spent Directly with Patient:  I have spent a total of 45 minutes with the patient reviewing hospital notes, telemetry, EKGs, labs and examining the patient as well as establishing an assessment and plan that was discussed personally with the patient. > 50% of time was spent in direct patient care.  Length of Stay:  LOS: 5 days   Pixie Casino, MD, [place]  Attending Cardiologist  Direct Dial: [phone]  Fax: [phone]  Website: [email or web address] Osgood Delanee Xin 02/20/2017, 2:59 PM

## 2017-02-20 NOTE — Progress Notes (Signed)
INFECTIOUS DISEASE PROGRESS NOTE  ID: Omar Dean is a 66 y.o. male with  Principal Problem:   Fever Active Problems:   Abdominal pain   PNA (pneumonia)   Benign essential HTN   T2DM (type 2 diabetes mellitus) (HCC)   Sinus pause  Subjective: No complaints.  No cough.   Abtx:  Anti-infectives    Start     Dose/Rate Route Frequency Ordered Stop   02/18/17 1400  levofloxacin (LEVAQUIN) IVPB 750 mg     750 mg 100 mL/hr over 90 Minutes Intravenous Every 48 hours 02/18/17 1251     02/16/17 1400  cefTRIAXone (ROCEPHIN) 2 g in dextrose 5 % 50 mL IVPB  Status:  Discontinued     2 g 100 mL/hr over 30 Minutes Intravenous Every 24 hours 02/16/17 1328 02/18/17 1204   02/16/17 1400  azithromycin (ZITHROMAX) tablet 500 mg  Status:  Discontinued     500 mg Oral Every 24 hours 02/16/17 1328 02/18/17 1204      Medications:  Scheduled: . amLODipine  10 mg Oral Daily  . aspirin EC  81 mg Oral Daily  . heparin  5,000 Units Subcutaneous Q8H  . insulin aspart  0-9 Units Subcutaneous TID WC  . pantoprazole (PROTONIX) IV  40 mg Intravenous QHS  . polyethylene glycol  17 g Oral Daily  . senna-docusate  1 tablet Oral Daily  . simvastatin  10 mg Oral QHS    Objective: Vital signs in last 24 hours: Temp:  [98 F (36.7 C)-102 F (38.9 C)] 98.6 F (37 C) (10/18 1318) Pulse Rate:  [87-89] 87 (10/18 1318) Resp:  [20] 20 (10/18 1318) BP: (108-117)/(61-94) 116/68 (10/18 1318) SpO2:  [99 %-100 %] 100 % (10/18 1318)   General appearance: alert, cooperative and no distress Resp: clear to auscultation bilaterally Cardio: regular rate and rhythm GI: normal findings: bowel sounds normal and soft, non-tender  Lab Results  Recent Labs  02/19/17 1249 02/19/17 1742 02/20/17 0514  WBC 5.6 6.8  --   HGB 9.2* 8.9*  --   HCT 29.3* 28.7*  --   NA 136  --   --   K 4.6  --   --   CL 105  --   --   CO2 26  --   --   BUN 25*  --   --   CREATININE 2.77*  --  2.92*   Liver  Panel No results for input(s): PROT, ALBUMIN, AST, ALT, ALKPHOS, BILITOT, BILIDIR, IBILI in the last 72 hours. Sedimentation Rate No results for input(s): ESRSEDRATE in the last 72 hours. C-Reactive Protein No results for input(s): CRP in the last 72 hours.  Microbiology: Recent Results (from the past 240 hour(s))  Urine culture     Status: Abnormal   Collection Time: 02/15/17  3:34 PM  Result Value Ref Range Status   Specimen Description URINE, RANDOM  Final   Special Requests NONE  Final   Culture (A)  Final    <10,000 COLONIES/mL INSIGNIFICANT GROWTH Performed at [facility], [address] [place], [place] [address]    Report Status 02/16/2017 FINAL  Final  Blood Culture (routine x 2)     Status: None   Collection Time: 02/15/17  3:45 PM  Result Value Ref Range Status   Specimen Description BLOOD BLOOD RIGHT FOREARM  Final   Special Requests   Final    BOTTLES DRAWN AEROBIC AND ANAEROBIC Blood Culture adequate volume   Culture  Final    NO GROWTH 5 DAYS Performed at [facility], [address] [place], [place] [address]    Report Status 02/20/2017 FINAL  Final  Blood Culture (routine x 2)     Status: None   Collection Time: 02/15/17  3:52 PM  Result Value Ref Range Status   Specimen Description BLOOD LEFT ANTECUBITAL  Final   Special Requests   Final    BOTTLES DRAWN AEROBIC AND ANAEROBIC Blood Culture adequate volume   Culture   Final    NO GROWTH 5 DAYS Performed at [facility], [address] [place], [place] [address]    Report Status 02/20/2017 FINAL  Final    Studies/Results: No results found.   Assessment/Plan: Fever Pulmonary Nodules Foreign Travel ARF vs CRF DM2  Total days of antibiotics: 5 levaqun  He has defervesced CR remains elevated FSG are 100-200 BCx are ngtd await his HIV ab If he is unable to make sputum, consider pulm curbside for consideration of bronch/bal.           Omar SaxJeffrey Katanya Schlie MD, FACP Infectious  Diseases (pager) [phone] [email or web address] 02/20/2017, 6:37 PM  LOS: 5 days

## 2017-02-20 NOTE — Progress Notes (Signed)
PROGRESS NOTE  Omar Dean  ZOX:[id number] DOB: July 31, 1950 DOA: 02/15/2017 PCP: Fleet Contras, MD   Brief Narrative: Omar Dean is a 66 y.o. male originally from [place] with a history of HTN and T2DM who presented to the ED with about 1 week of weakness, dyspnea on exertion and fevers. He states he had become generally "sick" since returning from [place] in September and has a chronic cough with occasional episodes of hemoptysis, but came in for these acute symptoms. He denies symptoms of weight loss, night sweats, or known exposures. He was febrile with CT chest showing GGOs suspicious for infectious or inflammatory etiologies. Ceftriaxone and azithromycin were started for pneumonia and he was admitted. Creatinine elevation was noted with unknown chronicity. Mild LE edema was noted, though had negative U/S for DVT and no cardiac dysfunction on echocardiogram. Cultures have been negative to date, though antibiotics were switched to levaquin due to persistent fevers which have continued despite this change. ID is consulted for recommendations.   Assessment & Plan: Principal Problem:   Fever Active Problems:   Abdominal pain   PNA (pneumonia)   Benign essential HTN   T2DM (type 2 diabetes mellitus) (HCC)   Sinus pause  Fevers, weakness, dyspnea: Suspected to be due to pneumonia found on CT chest, but has continued despite targeted abx. No hypoxia, and VQ scan low risk. - Continue to monitor blood cultures.  - Continue levaquin - HIV pending - RFs for TB, ID has ordered AFB smear though pt is producing no sputum. Will ask respiratory for induced sputum. Empiric airborne precautions.   Stage IV CKD: Presumptive Dx with stability of SCr with CrCl 15-47ml/min and longstanding history of HTN, DM.  U/S demonstrated increased echogenicity consistent with chronic medical renal disease.  - Renally dose medications - Monitor SCr, UOP - Avoid nephrotoxins, holding HCTZ/ARB. -  Control underlying etiologies. - Would benefit from outpatient nephrology referral.   Essential HTN:  - Continue norvasc - Holding ARB/HCTZ for now  Asymptomatic sinus bradycardia:  - Appreciate cardiology evaluation.  - Nocturnal pulse oximetry ordered, would benefit from outpatient stress testing.   T2DM: Treated with [facility] as outpatient and HbA1c 5.4%.  - Hold [facility] - Continue ASA, statin.   DVT prophylaxis: Subcutaneous heparin Code Status: Full Family Communication: None at bedside Disposition Plan: Anticipate DC home once work up completed and consistently afebrile.   Consultants:   ID  Procedures:   Echocardiogram 10/14: - Left ventricle: The cavity size was normal. Wall thickness was   increased in a pattern of mild LVH. Systolic function was normal.   The estimated ejection fraction was in the range of 60% to 65%.   Wall motion was normal; there were no regional wall motion   abnormalities. Left ventricular diastolic function parameters   were normal. - Aortic valve: Sclerosis without stenosis. There was no   regurgitation. - Left atrium: The atrium was normal in size. - Tricuspid valve: There was trivial regurgitation. - Pulmonary arteries: PA peak pressure: 29 mm Hg (S). - Inferior vena cava: The vessel was normal in size. The   respirophasic diameter changes were in the normal range (>= 50%),   consistent with normal central venous pressure.  Impressions: - LVEF 60-65%, mild LVH, normal wall motion, normal diastolic   function, normal LA size, trivial TR, RVSP 29 mmHg, normal IVC.  Antimicrobials:  Ceftriaxone, azithromycin 10/14, 10/15  Levaquin 10/16 >>   Subjective: Desperately wants to go home. Still had 102F fever last  night. No fever today. Having cough without any sputum.  Objective: Vitals:   02/19/17 2044 02/19/17 2231 02/20/17 0703 02/20/17 1318  BP: 108/61  (!) 117/94 116/68  Pulse: 89  88 87  Resp: 20  20 20   Temp: (!) 102 F (38.9  C) 99.9 F (37.7 C) 98 F (36.7 C) 98.6 F (37 C)  TempSrc: Oral Oral Oral Oral  SpO2: 99%  100% 100%  Weight:      Height:       Gen: Well-appearing 66 y.o. male in no distress  Pulm: Non-labored breathing room air. Clear to auscultation bilaterally.  CV: Regular rate and rhythm. No murmur, rub, or gallop. No JVD, trace pedal edema. GI: Abdomen soft, non-tender, non-distended, with normoactive bowel sounds. No organomegaly or masses felt. Ext: Warm, no deformities Skin: No rashes, or open wounds/ulcers Neuro: Alert and oriented. No focal neurological deficits. Psych: Judgement and insight appear normal. Mood & affect appropriate.   CBC:  Recent Labs Lab 02/15/17 1518 02/16/17 0500 02/19/17 1249 02/19/17 1742  WBC 5.7 5.8 5.6 6.8  NEUTROABS 4.3  --   --  5.0  HGB 11.7* 8.3* 9.2* 8.9*  HCT 36.7* 26.0* 29.3* 28.7*  MCV 89.3 88.7 88.5 88.3  PLT 237 255 252 288   Basic Metabolic Panel:  Recent Labs Lab 02/15/17 1518 02/16/17 0500 02/17/17 0512 02/19/17 1249 02/20/17 0514  NA 140 142 138 136  --   K 4.2 4.3 4.8 4.6  --   CL 107 111 108 105  --   CO2 25 27 25 26   --   GLUCOSE 84 95 134* 147*  --   BUN 26* 29* 25* 25*  --   CREATININE 2.80* 2.78* 2.57* 2.77* 2.92*  CALCIUM 10.4* 9.9 10.3 10.6*  --   MG  --   --  1.8  --   --    GFR: Estimated Creatinine Clearance: 38.2 mL/min (A) (by C-G formula based on SCr of 2.92 mg/dL (H)). Liver Function Tests:  Recent Labs Lab 02/15/17 1518 02/16/17 0500  AST 19 13*  ALT 18 15*  ALKPHOS 68 61  BILITOT 0.4 0.4  PROT 6.8 6.0*  ALBUMIN 3.3* 2.8*    Recent Labs Lab 02/15/17 1518  LIPASE 27   HbA1C:  Recent Labs  02/19/17 1742  HGBA1C 5.4   CBG:  Recent Labs Lab 02/19/17 1636 02/19/17 2037 02/20/17 0916 02/20/17 1157 02/20/17 1637  GLUCAP 127* 182* 117* 161* 119*   Urine analysis:    Component Value Date/Time   COLORURINE YELLOW 02/15/2017 1543   APPEARANCEUR CLEAR 02/15/2017 1543   LABSPEC  1.015 02/15/2017 1543   PHURINE 5.0 02/15/2017 1543   GLUCOSEU NEGATIVE 02/15/2017 1543   HGBUR SMALL (A) 02/15/2017 1543   BILIRUBINUR NEGATIVE 02/15/2017 1543   KETONESUR NEGATIVE 02/15/2017 1543   PROTEINUR >=300 (A) 02/15/2017 1543   NITRITE NEGATIVE 02/15/2017 1543   LEUKOCYTESUR NEGATIVE 02/15/2017 1543   Recent Results (from the past 240 hour(s))  Urine culture     Status: Abnormal   Collection Time: 02/15/17  3:34 PM  Result Value Ref Range Status   Specimen Description URINE, RANDOM  Final   Special Requests NONE  Final   Culture (A)  Final    <10,000 COLONIES/mL INSIGNIFICANT GROWTH Performed at [facility], [address] [place], [place] [address]    Report Status 02/16/2017 FINAL  Final  Blood Culture (routine x 2)     Status: None   Collection Time:  02/15/17  3:45 PM  Result Value Ref Range Status   Specimen Description BLOOD BLOOD RIGHT FOREARM  Final   Special Requests   Final    BOTTLES DRAWN AEROBIC AND ANAEROBIC Blood Culture adequate volume   Culture   Final    NO GROWTH 5 DAYS Performed at [facility], [address] [place], [place] [address]    Report Status 02/20/2017 FINAL  Final  Blood Culture (routine x 2)     Status: None   Collection Time: 02/15/17  3:52 PM  Result Value Ref Range Status   Specimen Description BLOOD LEFT ANTECUBITAL  Final   Special Requests   Final    BOTTLES DRAWN AEROBIC AND ANAEROBIC Blood Culture adequate volume   Culture   Final    NO GROWTH 5 DAYS Performed at [facility], [address] [place], [place] [address]    Report Status 02/20/2017 FINAL  Final      Radiology Studies: No results found.  Scheduled Meds: . amLODipine  10 mg Oral Daily  . aspirin EC  81 mg Oral Daily  . heparin  5,000 Units Subcutaneous Q8H  . insulin aspart  0-9 Units Subcutaneous TID WC  . pantoprazole (PROTONIX) IV  40 mg Intravenous QHS  . polyethylene glycol  17 g Oral Daily  . senna-docusate  1 tablet Oral  Daily  . simvastatin  10 mg Oral QHS   Continuous Infusions: . levofloxacin (LEVAQUIN) IV Stopped (02/20/17 1356)     LOS: 5 days   Time spent: 25 minutes.  Hazeline Junker, MD [facility] Pager [phone]  If 7PM-7AM, please contact night-coverage [email or web address] Password [email or web address] 02/20/2017, 5:13 PM

## 2017-02-20 NOTE — Progress Notes (Signed)
Tele monitoring notified nurse that patient had 4 second pause. MD notified.

## 2017-02-20 NOTE — Progress Notes (Signed)
Notified by lab blood parasite is negative Brennen Camper K, RN

## 2017-02-21 ENCOUNTER — Inpatient Hospital Stay (HOSPITAL_COMMUNITY): Payer: Self-pay

## 2017-02-21 DIAGNOSIS — R918 Other nonspecific abnormal finding of lung field: Secondary | ICD-10-CM

## 2017-02-21 DIAGNOSIS — J984 Other disorders of lung: Secondary | ICD-10-CM

## 2017-02-21 DIAGNOSIS — R911 Solitary pulmonary nodule: Secondary | ICD-10-CM

## 2017-02-21 LAB — HIV 1/2 AB DIFFERENTIATION
HIV 1 AB: NEGATIVE
HIV 2 Ab: NEGATIVE
Note: NEGATIVE

## 2017-02-21 LAB — GLUCOSE, CAPILLARY
GLUCOSE-CAPILLARY: 174 mg/dL — AB (ref 65–99)
Glucose-Capillary: 141 mg/dL — ABNORMAL HIGH (ref 65–99)
Glucose-Capillary: 123 mg/dL — ABNORMAL HIGH (ref 65–99)

## 2017-02-21 LAB — RNA QUALITATIVE: HIV 1 RNA Qualitative: 1

## 2017-02-21 LAB — BASIC METABOLIC PANEL
Anion gap: 6 (ref 5–15)
BUN: 25 mg/dL — AB (ref 6–20)
CALCIUM: 10.5 mg/dL — AB (ref 8.9–10.3)
CO2: 24 mmol/L (ref 22–32)
CREATININE: 2.75 mg/dL — AB (ref 0.61–1.24)
Chloride: 106 mmol/L (ref 101–111)
GFR calc Af Amer: 26 mL/min — ABNORMAL LOW (ref >60)
GFR, EST NON AFRICAN AMERICAN: 23 mL/min — AB (ref >60)
GLUCOSE: 143 mg/dL — AB (ref 65–99)
Potassium: 4.9 mmol/L (ref 3.5–5.1)
Sodium: 136 mmol/L (ref 135–145)

## 2017-02-21 LAB — HIV ANTIBODY (ROUTINE TESTING W REFLEX): HIV SCREEN 4TH GENERATION: REACTIVE — AB

## 2017-02-21 MED ORDER — LOSARTAN POTASSIUM 100 MG PO TABS: 100.0000 mg | ORAL_TABLET | Freq: Every day | ORAL | 0 refills | Status: AC

## 2017-02-21 MED ORDER — IOPAMIDOL (ISOVUE-300) INJECTION 61%: 15.0000 mL | Freq: Once | INTRAVENOUS | Status: DC | PRN

## 2017-02-21 MED ORDER — IOPAMIDOL (ISOVUE-300) INJECTION 61%
INTRAVENOUS | Status: AC
  Filled 2017-02-21: qty 30

## 2017-02-21 NOTE — Progress Notes (Signed)
Placed Over Night Pulse Oximetry results in PT chart at approximately 1415.

## 2017-02-21 NOTE — Progress Notes (Signed)
INFECTIOUS DISEASE PROGRESS NOTE  ID: Omar Dean is a 66 y.o. male with  Principal Problem:   Fever Active Problems:   Abdominal pain   PNA (pneumonia)   Benign essential HTN   T2DM (type 2 diabetes mellitus) (HCC)   Sinus pause   CKD (chronic kidney disease) stage 4, GFR 15-29 ml/min (HCC)  Subjective: Without complatins  Abtx:  Anti-infectives    Start     Dose/Rate Route Frequency Ordered Stop   02/18/17 1400  levofloxacin (LEVAQUIN) IVPB 750 mg     750 mg 100 mL/hr over 90 Minutes Intravenous Every 48 hours 02/18/17 1251     02/16/17 1400  cefTRIAXone (ROCEPHIN) 2 g in dextrose 5 % 50 mL IVPB  Status:  Discontinued     2 g 100 mL/hr over 30 Minutes Intravenous Every 24 hours 02/16/17 1328 02/18/17 1204   02/16/17 1400  azithromycin (ZITHROMAX) tablet 500 mg  Status:  Discontinued     500 mg Oral Every 24 hours 02/16/17 1328 02/18/17 1204      Medications:  Scheduled: . amLODipine  10 mg Oral Daily  . aspirin EC  81 mg Oral Daily  . heparin  5,000 Units Subcutaneous Q8H  . insulin aspart  0-9 Units Subcutaneous TID WC  . iopamidol      . pantoprazole (PROTONIX) IV  40 mg Intravenous QHS  . polyethylene glycol  17 g Oral Daily  . senna-docusate  1 tablet Oral Daily  . simvastatin  10 mg Oral QHS    Objective: Vital signs in last 24 hours: Temp:  [98.6 F (37 C)-100.2 F (37.9 C)] 99 F (37.2 C) (10/19 1243) Pulse Rate:  [82-88] 82 (10/19 1243) Resp:  [19-20] 20 (10/19 0618) BP: (112-120)/(68-73) 112/72 (10/19 1243) SpO2:  [98 %-100 %] 100 % (10/19 1243)   General appearance: alert, cooperative and no distress Resp: diminished breath sounds bilaterally and rhonchi bilaterally Cardio: regular rate and rhythm GI: normal findings: bowel sounds normal and soft, non-tender  Lab Results  Recent Labs  02/19/17 1249 02/19/17 1742 02/20/17 0514 02/21/17 0447  WBC 5.6 6.8  --   --   HGB 9.2* 8.9*  --   --   HCT 29.3* 28.7*  --   --   NA 136   --   --  136  K 4.6  --   --  4.9  CL 105  --   --  106  CO2 26  --   --  24  BUN 25*  --   --  25*  CREATININE 2.77*  --  2.92* 2.75*   Liver Panel No results for input(s): PROT, ALBUMIN, AST, ALT, ALKPHOS, BILITOT, BILIDIR, IBILI in the last 72 hours. Sedimentation Rate No results for input(s): ESRSEDRATE in the last 72 hours. C-Reactive Protein No results for input(s): CRP in the last 72 hours.  Microbiology: Recent Results (from the past 240 hour(s))  Urine culture     Status: Abnormal   Collection Time: 02/15/17  3:34 PM  Result Value Ref Range Status   Specimen Description URINE, RANDOM  Final   Special Requests NONE  Final   Culture (A)  Final    <10,000 COLONIES/mL INSIGNIFICANT GROWTH Performed at [facility], [address] [place], [place] [address]    Report Status 02/16/2017 FINAL  Final  Blood Culture (routine x 2)     Status: None   Collection Time: 02/15/17  3:45 PM  Result Value Ref  Range Status   Specimen Description BLOOD BLOOD RIGHT FOREARM  Final   Special Requests   Final    BOTTLES DRAWN AEROBIC AND ANAEROBIC Blood Culture adequate volume   Culture   Final    NO GROWTH 5 DAYS Performed at [facility], [address] [place], [place] [address]    Report Status 02/20/2017 FINAL  Final  Blood Culture (routine x 2)     Status: None   Collection Time: 02/15/17  3:52 PM  Result Value Ref Range Status   Specimen Description BLOOD LEFT ANTECUBITAL  Final   Special Requests   Final    BOTTLES DRAWN AEROBIC AND ANAEROBIC Blood Culture adequate volume   Culture   Final    NO GROWTH 5 DAYS Performed at [facility], [address] [place], [place] [address]    Report Status 02/20/2017 FINAL  Final    Studies/Results: No results found.   Assessment/Plan: Fever Pneumonia  appreicate CCM and TRH excellent care of pt.  He is planned for d/c soon I would suggest home off anbx His HIV RNA is (-) BAL AFB Cx pending.  Will f/u  with CCM for lung bx.  Available as needed.   Total days of antibiotics: 5 (levaquin)         Omar SaxJeffrey Marilyn Nihiser MD, FACP Infectious Diseases (pager) [phone] [email or web address] 02/21/2017, 1:15 PM  LOS: 6 days

## 2017-02-21 NOTE — Progress Notes (Signed)
Discharge paperwork reviewed with pt. All questions answered. IV and tele removed. Pts son here to take pt home. All belongings gathered and sent with pt. Pt escorted off floor with son.

## 2017-02-21 NOTE — Progress Notes (Signed)
RT called by RN to inform that pts. over nite pulse oximetry study was no longer displaying waveform, machine remains plugged in, RT noted that pulse oximeter probe was no longer lighted/replaced probe machine began showing oximetry waveform, RT to monitor.

## 2017-02-21 NOTE — Discharge Summary (Signed)
Physician Discharge Summary  Omar Dean QIO:[id number] DOB: 09-24-1950 DOA: 02/15/2017  PCP: Fleet Contras, MD  Admit date: 02/15/2017 Discharge date: 02/21/2017  Admitted From: Home Disposition: Home   Recommendations for Outpatient Follow-up:  1. Follow up with PCP in 1-2 weeks 2. Monitor renal function, suggest referral to nephrology for stage IV CKD. 3. Follow up with pulmonology 11/2 to be set up for bronchoscopy  Home Health: None Equipment/Devices: None Discharge Condition: Stable CODE STATUS: Full Diet recommendation: Heart healthy, carb-modified  Brief/Interim Summary: Omar Dean is a 66 y.o. male originally from [place] with a history of HTN and T2DM who presented to the ED with about 1 week of weakness, dyspnea on exertion and fevers. He states he had become generally "sick" since returning from [place] in September and has a chronic cough with occasional episodes of hemoptysis, but came in for these acute symptoms. He denies symptoms of weight loss, night sweats, or known exposures. He was febrile with CT chest showing GGOs suspicious for infectious or inflammatory etiologies. Ceftriaxone and azithromycin were started for pneumonia and he was admitted. Creatinine elevation was noted with unknown chronicity. Mild LE edema was noted, though had negative U/S for DVT and no cardiac dysfunction on echocardiogram. Cultures have been negative, though antibiotics were switched to levaquin due to persistent fevers which have resolved for the past 48 hours. ID was consulted, recommending AFB smear, though pt produces no sputum. Pulmonology was consulted for bronchoscopy and further recommendations. They agree with ID that the patient may be discharged off antibiotics and follow up 11/2 for bronchoscopy and further outpatient work up.   Discharge Diagnoses:  Principal Problem:   Fever Active Problems:   Abdominal pain   PNA (pneumonia)   Benign essential HTN    T2DM (type 2 diabetes mellitus) (HCC)   Sinus pause   CKD (chronic kidney disease) stage 4, GFR 15-29 ml/min (HCC)  Fevers, weakness, dyspnea: Suspected to be due to pneumonia found on CT chest, but has continued despite targeted abx. No hypoxia, and VQ scan low risk. Blood and urine cultures negative.   - Has received 6 days of antibiotics and has defervesced. Per ID, will DC off antibiotics.  - HIV RNA negative.   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  - Pulmonology planning outpatient work up with bronchoscopy for DDx including Birt-Hogg-Dub disease, LAM (not in a male), langerhan histocytosis and LIP - F/U with pulmonology on 11/2 at 1:30 PM and suspect a bronchoscopy will be arranged  Stage IV CKD: Presumptive Dx with stability of SCr with CrCl 15-57ml/min and longstanding history of HTN, DM. U/S demonstrated increased echogenicity consistent with chronic medical renal disease.  - Renally dose medications - Monitor BMP as outpatient, would benefit from nephrology referral.  - Avoid nephrotoxins.  Essential HTN:  - Continue norvasc and ARB, and stop HCTZ due to low-normal BP and low GFR.  - Consider holding norvasc if BP remains low-normal.   Asymptomatic sinus bradycardia:  - Appreciate cardiology evaluation.  - Nocturnal pulse oximetry ordered (see report), would benefit from outpatient stress testing.   T2DM: Treated with [facility] as outpatient and HbA1c 5.4%.  - Hold [facility] given renal insufficiency. Will need PCP follow up.  - Continue ASA, statin.   Discharge Instructions Discharge Instructions    Diet - low sodium heart healthy    Complete by:  As directed    Diet Carb Modified    Complete by:  As directed  Discharge instructions    Complete by:  As directed    STOP taking amaryl because of your kidney impairment.  STOP taking losartan-HCTZ (combination pill) and instead take losartan by itself (printed prescription for you) Continue taking  other medications You do not need any further antibiotics Follow up with the lung doctor, Dr. Jamison Neighbor, on 11/2 as below.  Also follow up with Dr. Concepcion Elk in the next 1-2 weeks.     Allergies as of 02/21/2017   No Known Allergies     Medication List    STOP taking these medications   glimepiride 4 MG tablet Commonly known as:  AMARYL   losartan-hydrochlorothiazide 100-25 MG tablet Commonly known as:  HYZAAR     TAKE these medications   amLODipine 10 MG tablet Commonly known as:  NORVASC Take 10 mg by mouth daily.   aspirin EC 81 MG tablet Take 81 mg by mouth daily.   furosemide 20 MG tablet Commonly known as:  LASIX Take 20 mg by mouth daily as needed for edema.   losartan 100 MG tablet Commonly known as:  COZAAR Take 1 tablet (100 mg total) by mouth daily.   simvastatin 10 MG tablet Commonly known as:  ZOCOR Take 10 mg by mouth at bedtime.      Follow-up Information    Roslynn Amble, MD Follow up on 03/07/2017.   Specialty:  Pulmonary Disease Why:  Appt at 1:30 PM.  Please arrive at 1:15 for paperwork.   Contact information: [address] [place] [address] [phone]        Fleet Contras, MD. Schedule an appointment as soon as possible for a visit in 1 week(s).   Specialty:  Internal Medicine Contact information: [address] [place] [address] [phone]          No Known Allergies  Consultations:  Pulmonology, [place]  Infectious Disease, [place]  Procedures/Studies: Ct Abdomen Pelvis Wo Contrast  Result Date: 02/21/2017 CLINICAL DATA:  Abdominal pain and diarrhea EXAM: CT ABDOMEN AND PELVIS WITHOUT CONTRAST TECHNIQUE: Multidetector CT imaging of the abdomen and pelvis was performed following the standard protocol without IV contrast. COMPARISON:  None. FINDINGS: Lower chest: 3 mm inferior right upper lobe nodule. 6 mm right lower lobe nodule. 5 mm left lower lobe pulmonary nodule. No pleural effusion.  Hepatobiliary: Normal hepatic contours and density. No visible biliary dilatation. Normal gallbladder. Pancreas: Normal contours without ductal dilatation. No peripancreatic fluid collection. Spleen: Normal. Adrenals/Urinary Tract: --Adrenal glands: Normal. --Right kidney/ureter: No hydronephrosis or perinephric stranding. No nephrolithiasis. No obstructing ureteral stones. --Left kidney/ureter: No hydronephrosis or perinephric stranding. No nephrolithiasis. No obstructing ureteral stones. --Urinary bladder: Unremarkable. Stomach/Bowel: --Stomach/Duodenum: No hiatal hernia or other gastric abnormality. Normal duodenal course and caliber. --Small bowel: No dilatation or inflammation. --Colon: No focal abnormality. --Appendix: Normal. Vascular/Lymphatic: Normal course and caliber of the major abdominal vessels. No abdominal or pelvic lymphadenopathy. Reproductive: Normal prostate and seminal vesicles. Musculoskeletal. No bony spinal canal stenosis or focal osseous abnormality. Moderate L4-L5 facet arthrosis. Other: None. IMPRESSION: 1. No acute abdominopelvic abnormality. 2. Multiple bibasilar pulmonary nodules, measuring up to 6 mm. Non-contrast chest CT at 3-6 months is recommended. If the nodules are stable at time of repeat CT, then future CT at 18-24 months (from today's scan) is considered optional for low-risk patients, but is recommended for high-risk patients. This recommendation follows the consensus statement: Guidelines for Management of Incidental Pulmonary Nodules Detected on CT Images: From the Fleischner Society 2017; Radiology 2017;  161:096-045. Electronically Signed   By: Deatra Robinson M.D.   On: 02/21/2017 18:41   Dg Chest 2 View  Result Date: 02/15/2017 CLINICAL DATA:  Cough and fever for 2 months. EXAM: CHEST  2 VIEW COMPARISON:  None. FINDINGS: The cardiac silhouette is enlarged. Mediastinal contours appear intact. There is no evidence of focal airspace consolidation, pleural effusion or  pneumothorax. Mild increase of the interstitial markings. Osseous structures are without acute abnormality. Soft tissues are grossly normal. IMPRESSION: Enlarged cardiac silhouette. Mild increase of the interstitial markings usually seen with interstitial pulmonary edema. Electronically Signed   By: Ted Mcalpine M.D.   On: 02/15/2017 13:20   Ct Chest Wo Contrast  Result Date: 02/15/2017 CLINICAL DATA:  Chronic cough.  Fever. EXAM: CT CHEST WITHOUT CONTRAST TECHNIQUE: Multidetector CT imaging of the chest was performed following the standard protocol without IV contrast. COMPARISON:  Chest x-ray from same day. FINDINGS: Cardiovascular: No significant vascular findings. Normal heart size. No pericardial effusion. Mediastinum/Nodes: No enlarged mediastinal or axillary lymph nodes. Thyroid gland, trachea, and esophagus demonstrate no significant findings. Lungs/Pleura: There are few scattered peripheral peribronchovascular ground-glass opacities in the right lung. Small focal area of tree-in-bud opacities in the right middle lobe. There is a 6 mm solid nodule in the left lower lobe (series 7, image 75). There are two 5 mm nodules in the right lower lobe (series 7, image 56, image 60). Punctate calcified granuloma in the anterior right upper lobe. No pleural effusion or pneumothorax. Upper Abdomen: No acute abnormality.  Hepatic steatosis. Musculoskeletal: No acute or suspicious osseous findings. IMPRESSION: 1. Few scattered peripheral peribronchovascular ground-glass opacities in the right lung, with small more focal area of tree-in-bud opacities in the right middle lobe. Findings are likely infectious or inflammatory in etiology. 2. Several solid pulmonary nodules in the bilateral lower lobes, measuring up to 6 mm. Non-contrast chest CT at 3-6 months is recommended. If the nodules are stable at time of repeat CT, then future CT at 18-24 months (from today's scan) is considered optional for low-risk  patients, but is recommended for high-risk patients. This recommendation follows the consensus statement: Guidelines for Management of Incidental Pulmonary Nodules Detected on CT Images: From the Fleischner Society 2017; Radiology 2017; 284:228-243. 3. Hepatic steatosis. Electronically Signed   By: Obie Dredge M.D.   On: 02/15/2017 20:12   US Renal  Result Date: 02/15/2017 CLINICAL DATA:  Initial evaluation for acute renal injury. EXAM: RENAL / URINARY TRACT ULTRASOUND COMPLETE COMPARISON:  None available. FINDINGS: Right Kidney: Length: 11.0 cm. Increased echogenicity within the renal parenchymal, suggesting medical renal disease. No mass or hydronephrosis visualized. Left Kidney: Length: 10.7 cm. Increased echogenicity within the renal parenchyma, suggesting medical renal disease. No solid mass or hydronephrosis visualized. 1.0 x 0.7 x 0.5 cm simple cyst noted at the upper pole left kidney. Bladder: Appears normal for degree of bladder distention. IMPRESSION: 1. Increased echogenicity within the renal parenchyma, compatible with medical renal disease. No hydronephrosis. 2. 1 cm simple left renal cyst. Electronically Signed   By: Rise Mu M.D.   On: 02/15/2017 21:25   Nm Pulmonary Perf And Vent  Result Date: 02/16/2017 CLINICAL DATA:  66 year old male with shortness of breath, cough for 2 months. EXAM: NUCLEAR MEDICINE VENTILATION - PERFUSION LUNG SCAN TECHNIQUE: Ventilation images were obtained in multiple projections using inhaled aerosol Tc-76m DTPA. Perfusion images were obtained in multiple projections after intravenous injection of Tc-90m MAA. RADIOPHARMACEUTICALS:  30.1 mCi Technetium-17m DTPA aerosol inhalation and 4.0 mCi  Technetium-204m MAA IV COMPARISON:  Chest CT and radiographs 02/15/2017. FINDINGS: Ventilation: Mild artifactual clumping of radiotracer about the central airways. Some gastric contamination. No definite ventilation defect. Perfusion: Homogeneous.  No defect.  IMPRESSION: Negative.  No perfusion defect to suggest pulmonary embolus. Electronically Signed   By: Odessa FlemingH  Hall M.D.   On: 02/16/2017 15:52    Echocardiogram 10/14: - Left ventricle: The cavity size was normal. Wall thickness was increased in a pattern of mild LVH. Systolic function was normal. The estimated ejection fraction was in the range of 60% to 65%. Wall motion was normal; there were no regional wall motion abnormalities. Left ventricular diastolic function parameters were normal. - Aortic valve: Sclerosis without stenosis. There was no regurgitation. - Left atrium: The atrium was normal in size. - Tricuspid valve: There was trivial regurgitation. - Pulmonary arteries: PA peak pressure: 29 mm Hg (S). - Inferior vena cava: The vessel was normal in size. The respirophasic diameter changes were in the normal range (>= 50%), consistent with normal central venous pressure.  Impressions: - LVEF 60-65%, mild LVH, normal wall motion, normal diastolic function, normal LA size, trivial TR, RVSP 29 mmHg, normal IVC.  Subjective: Feel well. Denies dyspnea, cough, hemoptysis or fever.   Discharge Exam: Vitals:   02/21/17 0618 02/21/17 1243  BP: 116/73 112/72  Pulse: 88 82  Resp: 20 18  Temp: 100.2 F (37.9 C) 99 F (37.2 C)  SpO2: 98% 100%   General: Pt is alert, awake, not in acute distress Cardiovascular: RRR, S1/S2 +, no rubs, no gallops Respiratory: Nonlabored, decreased bilaterally with scant rhonchi diffusely.  Abdominal: Soft, NT, ND, bowel sounds + Extremities: No edema, no cyanosis  Labs: BNP (last 3 results)  Recent Labs  02/15/17 1518  BNP 12.3   Basic Metabolic Panel:  Recent Labs Lab 02/15/17 1518 02/16/17 0500 02/17/17 0512 02/19/17 1249 02/20/17 0514 02/21/17 0447  NA 140 142 138 136  --  136  K 4.2 4.3 4.8 4.6  --  4.9  CL 107 111 108 105  --  106  CO2 25 27 25 26   --  24  GLUCOSE 84 95 134* 147*  --  143*  BUN 26* 29* 25*  25*  --  25*  CREATININE 2.80* 2.78* 2.57* 2.77* 2.92* 2.75*  CALCIUM 10.4* 9.9 10.3 10.6*  --  10.5*  MG  --   --  1.8  --   --   --    Liver Function Tests:  Recent Labs Lab 02/15/17 1518 02/16/17 0500  AST 19 13*  ALT 18 15*  ALKPHOS 68 61  BILITOT 0.4 0.4  PROT 6.8 6.0*  ALBUMIN 3.3* 2.8*    Recent Labs Lab 02/15/17 1518  LIPASE 27   CBC:  Recent Labs Lab 02/15/17 1518 02/16/17 0500 02/19/17 1249 02/19/17 1742  WBC 5.7 5.8 5.6 6.8  NEUTROABS 4.3  --   --  5.0  HGB 11.7* 8.3* 9.2* 8.9*  HCT 36.7* 26.0* 29.3* 28.7*  MCV 89.3 88.7 88.5 88.3  PLT 237 255 252 288   CBG:  Recent Labs Lab 02/20/17 1637 02/20/17 2150 02/21/17 0747 02/21/17 1210 02/21/17 1724  GLUCAP 119* 147* 141* 174* 123*   Hgb A1c  Recent Labs  02/19/17 1742  HGBA1C 5.4   Urinalysis    Component Value Date/Time   COLORURINE YELLOW 02/15/2017 1543   APPEARANCEUR CLEAR 02/15/2017 1543   LABSPEC 1.015 02/15/2017 1543   PHURINE 5.0 02/15/2017 1543   GLUCOSEU NEGATIVE 02/15/2017 1543  HGBUR SMALL (A) 02/15/2017 1543   BILIRUBINUR NEGATIVE 02/15/2017 1543   KETONESUR NEGATIVE 02/15/2017 1543   PROTEINUR >=300 (A) 02/15/2017 1543   NITRITE NEGATIVE 02/15/2017 1543   LEUKOCYTESUR NEGATIVE 02/15/2017 1543    Microbiology Recent Results (from the past 240 hour(s))  Urine culture     Status: Abnormal   Collection Time: 02/15/17  3:34 PM  Result Value Ref Range Status   Specimen Description URINE, RANDOM  Final   Special Requests NONE  Final   Culture (A)  Final    <10,000 COLONIES/mL INSIGNIFICANT GROWTH Performed at [facility], [address], [place], [place] [address]    Report Status 02/16/2017 FINAL  Final  Blood Culture (routine x 2)     Status: None   Collection Time: 02/15/17  3:45 PM  Result Value Ref Range Status   Specimen Description BLOOD BLOOD RIGHT FOREARM  Final   Special Requests   Final    BOTTLES DRAWN AEROBIC AND ANAEROBIC Blood Culture  adequate volume   Culture   Final    NO GROWTH 5 DAYS Performed at [facility], [address], [place], [place] [address]    Report Status 02/20/2017 FINAL  Final  Blood Culture (routine x 2)     Status: None   Collection Time: 02/15/17  3:52 PM  Result Value Ref Range Status   Specimen Description BLOOD LEFT ANTECUBITAL  Final   Special Requests   Final    BOTTLES DRAWN AEROBIC AND ANAEROBIC Blood Culture adequate volume   Culture   Final    NO GROWTH 5 DAYS Performed at [facility], [address] [place], [place] [address]    Report Status 02/20/2017 FINAL  Final    Time coordinating discharge: Approximately 40 minutes  Hazeline Junker, MD  [facility] 02/21/2017, 6:44 PM Pager [phone]

## 2017-02-21 NOTE — Progress Notes (Signed)
Pt. taken off over nite pulse oximetry study, RN made aware.

## 2017-02-21 NOTE — Progress Notes (Signed)
Pt. placed on over nite pulse oximeter per order, RN aware @ bedside, pt. on room air upon arrival with 100% saturations, denies CPAP/BiPAP use prior to this admission, RT to monitor, plan to remove on 02/21/2017 @ 0600 to be downloaded.

## 2017-02-21 NOTE — Consult Note (Signed)
Name: Omar Dean MRN: [id number] DOB: 1951/04/24    ADMISSION DATE:  02/15/2017 CONSULTATION DATE:  02/21/2017  REFERRING MD :  Jarvis Newcomer  CHIEF COMPLAINT:  Cystic lung disease with GGO  BRIEF PATIENT DESCRIPTION: 66 year old that was normal health until 3 months ago when he was in [place] when he vomited a large amount of fresh blood.  Had no other symptoms.  Came back to the [place] and did not seek medical help until now when he came in complaining of abdominal pain.  A CT of the chest was done that revealed some GGO and small thin wall cysts throughout both lungs.  Patient reports chronic non-productive and some DOE.  No sputum production, hemoptysis, fevers, weight loss or history of any exposure to TB.  Recent PPD was negative upon immigrating from [place] to the [place].    SIGNIFICANT EVENTS  Admission 10/13  STUDIES:  CT of the chest that I reviewed myself that revealed multiple thin wall cysts and small areas of GGO  HISTORY OF PRESENT ILLNESS:  66 year old that was normal health until 3 months ago when he was in [place] when he vomited a large amount of fresh blood.  Had no other symptoms.  Came back to the [place] and did not seek medical help until now when he came in complaining of abdominal pain.  A CT of the chest was done that revealed some GGO and small thin wall cysts throughout both lungs.  Patient reports chronic non-productive and some DOE.  No sputum production, hemoptysis, fevers, weight loss or history of any exposure to TB.  Recent PPD was negative upon immigrating from [place] to the [place].    PAST MEDICAL HISTORY :   has a past medical history of Diabetes mellitus without complication (HCC) and Hypertension.  has a past surgical history that includes Knee surgery (Left). Prior to Admission medications   Medication Sig Start Date End Date Taking? Authorizing Provider  amLODipine (NORVASC) 10 MG tablet Take 10 mg by mouth daily.   Yes [provider]  aspirin EC  81 MG tablet Take 81 mg by mouth daily.   Yes [provider]  furosemide (LASIX) 20 MG tablet Take 20 mg by mouth daily as needed for edema.   Yes [provider]  glimepiride (AMARYL) 4 MG tablet Take 4 mg by mouth daily.   Yes [provider]  losartan-hydrochlorothiazide (HYZAAR) 100-25 MG tablet Take 1 tablet by mouth daily.   Yes [provider]  simvastatin (ZOCOR) 10 MG tablet Take 10 mg by mouth at bedtime.   Yes [provider]   No Known Allergies  FAMILY HISTORY:  family history is not on file. SOCIAL HISTORY:  reports that he has never smoked. He has never used smokeless tobacco. He reports that he does not drink alcohol.  REVIEW OF SYSTEMS:   Constitutional: Negative for fever, chills, weight loss, malaise/fatigue and diaphoresis.  HENT: Negative for hearing loss, ear pain, nosebleeds, congestion, sore throat, neck pain, tinnitus and ear discharge.   Eyes: Negative for blurred vision, double vision, photophobia, pain, discharge and redness.  Respiratory: Negative for cough, hemoptysis, sputum production, shortness of breath, wheezing and stridor.   Cardiovascular: Negative for chest pain, palpitations, orthopnea, claudication, leg swelling and PND.  Gastrointestinal: Negative for heartburn, nausea, vomiting, abdominal pain, diarrhea, constipation, blood in stool and melena.  Genitourinary: Negative for dysuria, urgency, frequency, hematuria and flank pain.  Musculoskeletal: Negative for myalgias, back pain, joint  pain and falls.  Skin: Negative for itching and rash.  Neurological: Negative for dizziness, tingling, tremors, sensory change, speech change, focal weakness, seizures, loss of consciousness, weakness and headaches.  Endo/Heme/Allergies: Negative for environmental allergies and polydipsia. Does not bruise/bleed easily.  SUBJECTIVE:   VITAL SIGNS: Temp:  [98.6 F (37 C)-100.2 F (37.9 C)] 100.2 F (37.9 C) (10/19  0618) Pulse Rate:  [83-88] 88 (10/19 0618) Resp:  [19-20] 20 (10/19 0618) BP: (116-120)/(68-73) 116/73 (10/19 0618) SpO2:  [98 %-100 %] 98 % (10/19 0618)  PHYSICAL EXAMINATION: General:  Well appearing, NAD Neuro:  Alert and interactive, moving all ext to command HEENT:  [place]/AT, PERRL, EOM-I and MMM Cardiovascular:  RRR, Nl S1/S2, -M/R/G Lungs:  CTA bilaterally Abdomen:  Epigastric tenderness, soft, ND and +BS Musculoskeletal:  -edema and -tenderness Skin:  Intact   Recent Labs Lab 02/17/17 0512 02/19/17 1249 02/20/17 0514 02/21/17 0447  NA 138 136  --  136  K 4.8 4.6  --  4.9  CL 108 105  --  106  CO2 25 26  --  24  BUN 25* 25*  --  25*  CREATININE 2.57* 2.77* 2.92* 2.75*  GLUCOSE 134* 147*  --  143*    Recent Labs Lab 02/16/17 0500 02/19/17 1249 02/19/17 1742  HGB 8.3* 9.2* 8.9*  HCT 26.0* 29.3* 28.7*  WBC 5.8 5.6 6.8  PLT 255 252 288   No results found.  Discussed with PCCM-NP  ASSESSMENT / PLAN:  66 year old male with PMH of DM and HTN presenting with cystic lung disease and GGO.  This is highly atypical for TB and given lack of fever, hemoptysis or weight loss and the atypical CT imaging and recent testing, this is highly atypical.  DDx is Birt-Hogg-Dub disease, LAM (not in a male), langerhan histocytosis and LIP.  Unfortunately little blood work can be sent to effectively diagnose this and a bronchoscopy is necessary.  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  Unfortunately patient ate today and to do biopsy it would be Monday prior to us being able to do anything.  Given that, will allow to discharge patient as this is typically an outpatient work up.  F/U on 11/2 at 1:30 PM and suspect a bronchoscopy will be arranged.  PCCM will sign off, please call back if needed.  Alyson ReedyWesam G. Monika Chestang, M.D. [facility]. Pager: [phone]. After hours  pager: [phone].  02/21/2017, 11:44 AM

## 2017-02-23 LAB — RNA QUALITATIVE

## 2017-02-23 LAB — HIV 1/2 AB DIFFERENTIATION
HIV 1 Ab: NEGATIVE
HIV 2 Ab: NEGATIVE
NOTE (HIV CONF MULTISPOT): NEGATIVE

## 2017-02-23 LAB — HIV ANTIBODY (ROUTINE TESTING W REFLEX): HIV Screen 4th Generation wRfx: REACTIVE — AB

## 2017-02-24 LAB — PARASITE EXAM, BLOOD

## 2017-02-26 ENCOUNTER — Telehealth: Payer: Self-pay | Admitting: Infectious Diseases

## 2017-02-27 NOTE — Telephone Encounter (Signed)
error 

## 2017-03-07 ENCOUNTER — Inpatient Hospital Stay: Payer: Self-pay | Admitting: Pulmonary Disease

## 2017-03-10 ENCOUNTER — Telehealth: Payer: Self-pay | Admitting: Infectious Diseases

## 2017-03-10 NOTE — Telephone Encounter (Signed)
error 

## 2017-03-20 NOTE — Progress Notes (Deleted)
Subjective:    Patient ID: Omar Dean, male    DOB: 1951-03-26, 66 y.o.   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   HPI Consultation and hospitalization documentation were reviewed prior to patient's appointment. Patient was hospitalized on 10/13 & discharge on 10/19. Consultation note reports that patient was in normal state of health until 3 months prior to hospitalization when he was in [place] and had emesis of a large amount of fresh blood. Patient reportedly had no other symptoms at that time. [place] and [facility]. Imaging revealed groundglass opacities as well as thin-walled cysts around his lungs. This prompted pulmonary consultation. Per documentation patient denied any productive cough, hemoptysis, fevers, weight loss, or history of TB exposure. Recent PPD performed upon immigrating from [place] was negative.  Cystic Lung Disease:  Review of Systems   No Known Allergies  Current Outpatient Medications on File Prior to Visit  Medication Sig Dispense Refill  . amLODipine (NORVASC) 10 MG tablet Take 10 mg by mouth daily.    Marland Kitchen. aspirin EC 81 MG tablet Take 81 mg by mouth daily.    . furosemide (LASIX) 20 MG tablet Take 20 mg by mouth daily as needed for edema.    Marland Kitchen. losartan (COZAAR) 100 MG tablet Take 1 tablet (100 mg total) by mouth daily. 30 tablet 0  . simvastatin (ZOCOR) 10 MG tablet Take 10 mg by mouth at bedtime.     No current facility-administered medications on file prior to visit.     Past Medical History:  Diagnosis Date  . Diabetes mellitus without complication (HCC)   . Hypertension     Past Surgical History:  Procedure Laterality Date  . KNEE SURGERY Left     No family history on file.  Social History   Socioeconomic History  . Marital status: Single    Spouse name: Not on file  . Number of children: Not on file  . Years of  education: Not on file  . Highest education level: Not on file  Social Needs  . Financial resource strain: Not on file  . Food insecurity - worry: Not on file  . Food insecurity - inability: Not on file  . Transportation needs - medical: Not on file  . Transportation needs - non-medical: Not on file  Occupational History  . Not on file  Tobacco Use  . Smoking status: Never Smoker  . Smokeless tobacco: Never Used  Substance and Sexual Activity  . Alcohol use: No  . Drug use: Not on file  . Sexual activity: Not on file  Other Topics Concern  . Not on file  Social History Narrative  . Not on file      Objective:   Physical Exam There were no vitals taken for this visit. General:  Awake. Alert. No acute distress. Sitting watching TV. Family at bedside.  Integument:  Warm & dry. No rash on exposed skin. No bruising. Extremities:  No cyanosis or clubbing.  HEENT:  Moist mucus membranes. No oral ulcers. No scleral injection or icterus. Endotracheal tube in place. PERRL. Cardiovascular:  Regular rate. No edema. No appreciable JVD.  Pulmonary:  Good aeration & clear to auscultation bilaterally. Symmetric chest wall expansion. No accessory muscle use. Abdomen: Soft. Normal bowel sounds. Nondistended. Grossly nontender. Musculoskeletal:  Normal bulk and tone. Hand grip strength 5/5 bilaterally. No joint deformity or effusion appreciated. Neurological:  Cranial  nerves 2-12 grossly in tact. No meningismus. Moving all 4 extremities equally.   IMAGING CT CHEST W/O 02/15/17 (personally reviewed by me):  Scattered subcentimeter nodules most of which are at least partially groundglass predominantly within the lower lobes. Scattered lower lobe predominant thin-walled cysts. Minimal tree-in-bud opacity within the right middle lobe. No pleural effusion or thickening. No pericardial effusion. No pathologic mediastinal adenopathy.  CARDIAC TTE (02/16/17):  LV normal in size with mild LVH. EF 60-65%.  No regional wall motion abnormalities. Normal diastolic function. LA & RA normal in size. RV normal in size and function. No aortic regurgitation or stenosis. Aortic root normal in size. No mitral stenosis or regurgitation. No pulmonic regurgitation. Trivial tricuspid regurgitation. No pericardial effusion.  MICROBIOLOGY Blood Cultures x2 02/15/17:  Negative HIV 02/16/17:  Negative     Assessment & Plan:  66 y.o.  Omar Dean, M.D. [facility] Pager:  [phone] After 7pm or if no response, call [phone] 5:34 PM 03/20/17

## 2017-03-21 ENCOUNTER — Inpatient Hospital Stay: Payer: Self-pay | Admitting: Pulmonary Disease

## 2017-05-12 ENCOUNTER — Telehealth: Payer: Self-pay | Admitting: Infectious Diseases

## 2017-05-12 NOTE — Telephone Encounter (Signed)
closed

## 2019-01-03 IMAGING — CT CT CHEST W/O CM
2 of 4 series · 15 of 36 positions shown, 18 images · non-contrast
Comparison: Chest x-ray from same day.

CLINICAL DATA: Chronic cough.  Fever.

EXAM:
CT CHEST WITHOUT CONTRAST
TECHNIQUE: Multidetector CT imaging of the chest was performed following the
standard protocol without IV contrast.

[Series 2: chest w/o st · axial · non-contrast · 0.84mm/px · z∈[+1653,+1939]mm · 12 of 166 slices shown, 15 images]
[im 12/166  mediastinal]
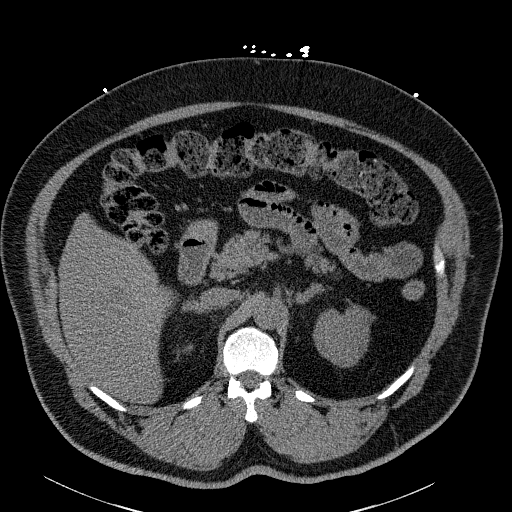
[im 12/166  lung]
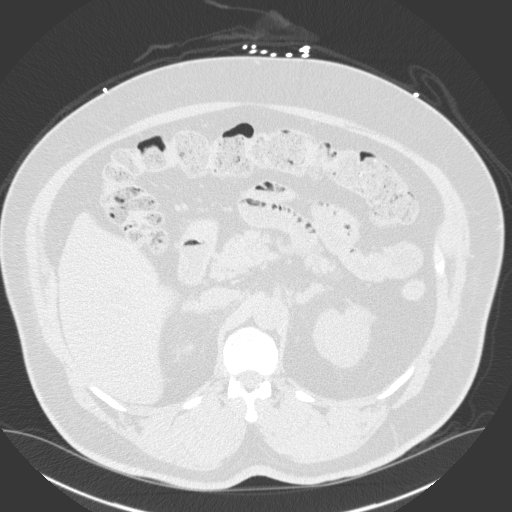
[im 23/166  lung]
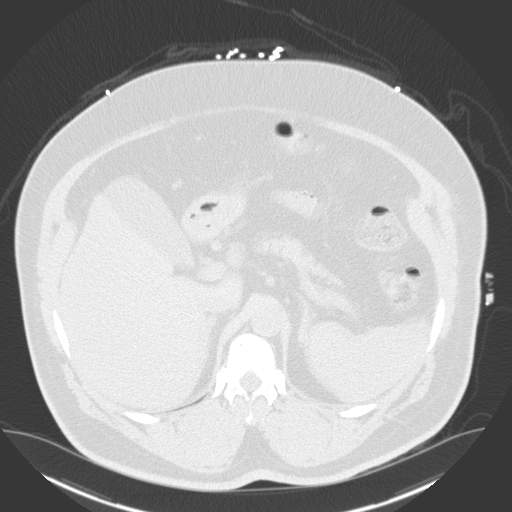
[im 34/166  lung]
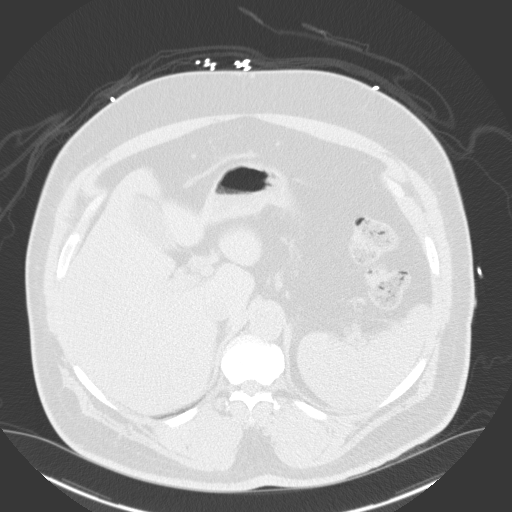
[im 56/166  lung]
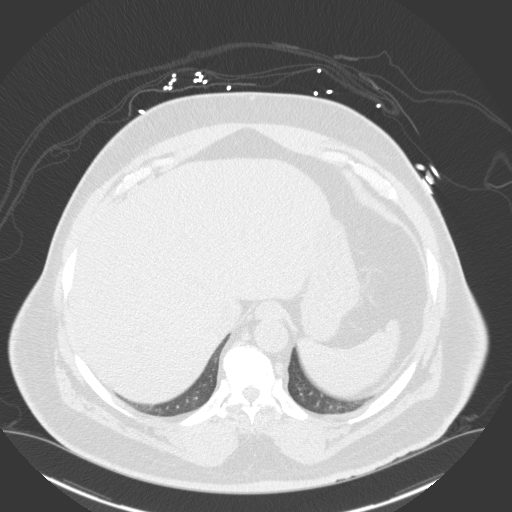
[im 67/166  mediastinal]
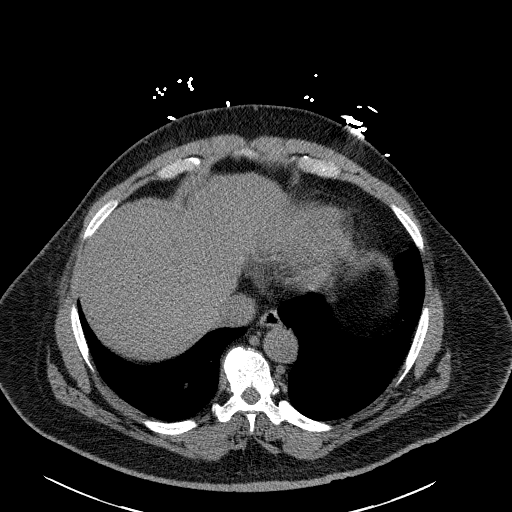
[im 67/166  lung]
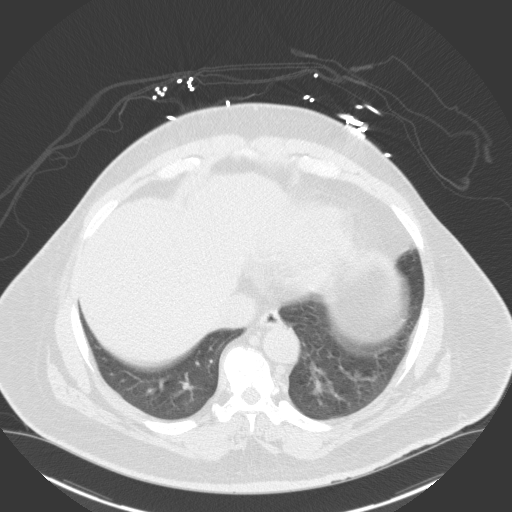
[im 78/166  lung]
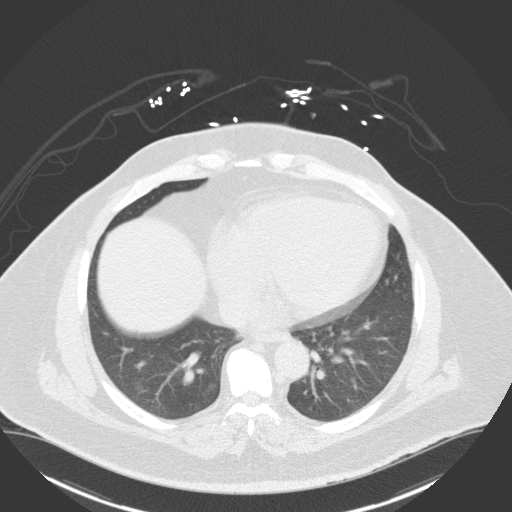
[im 89/166  lung]
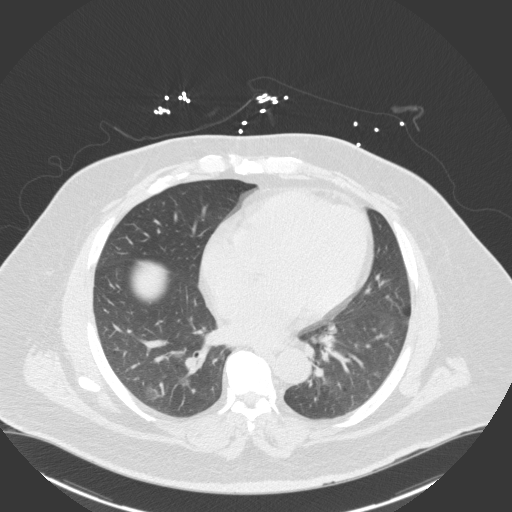
[im 100/166  lung]
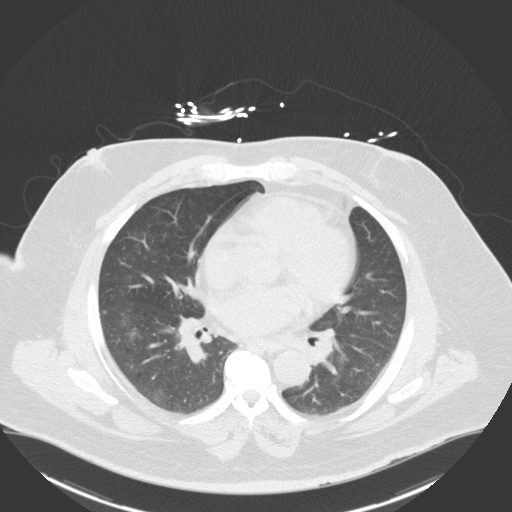
[im 111/166  mediastinal]
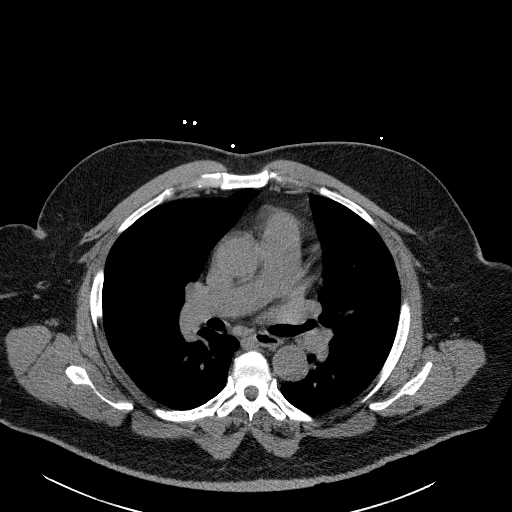
[im 111/166  lung]
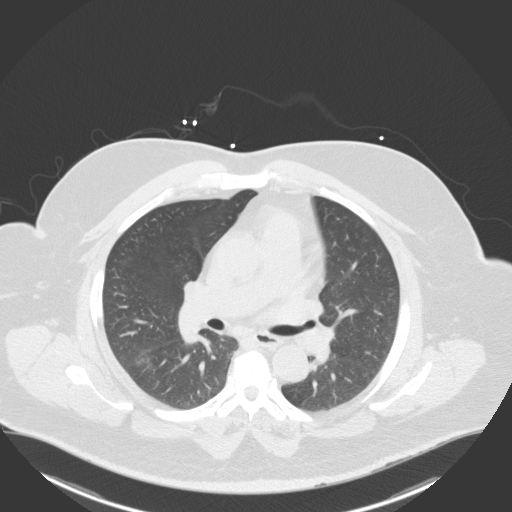
[im 133/166  lung]
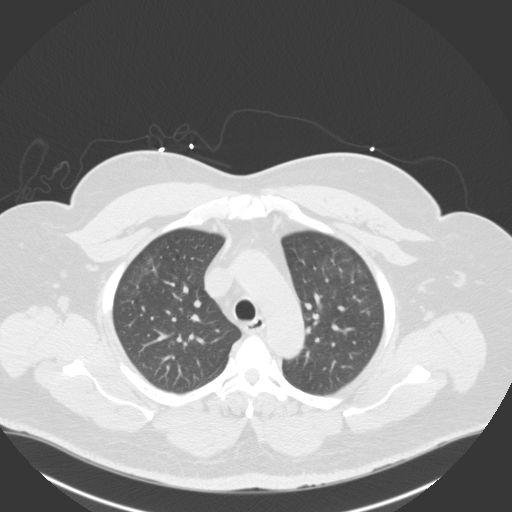
[im 144/166  lung]
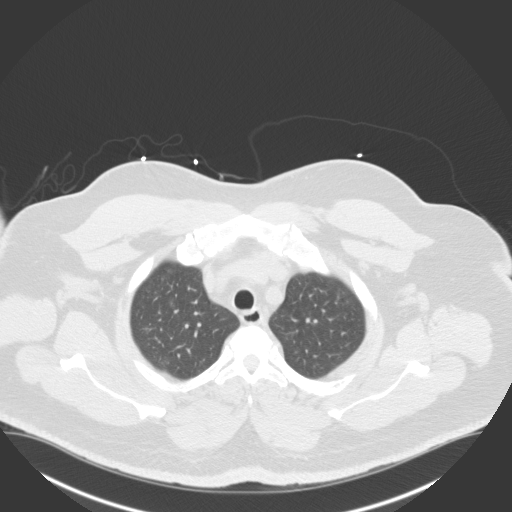
[im 155/166  lung]
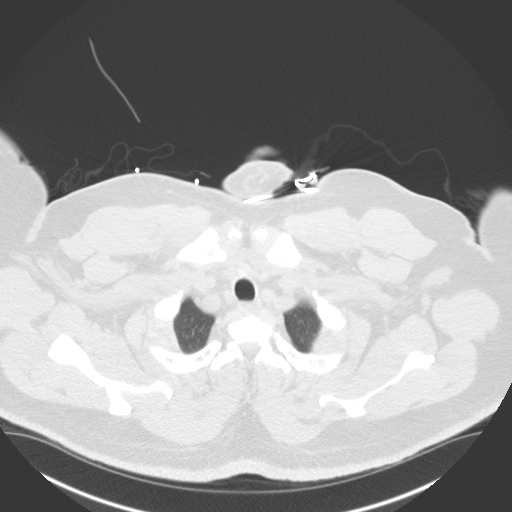

[Series 3: coronals · coronal · 0.71mm/px · 3 of 155 slices shown]
[im 31/155  lung]
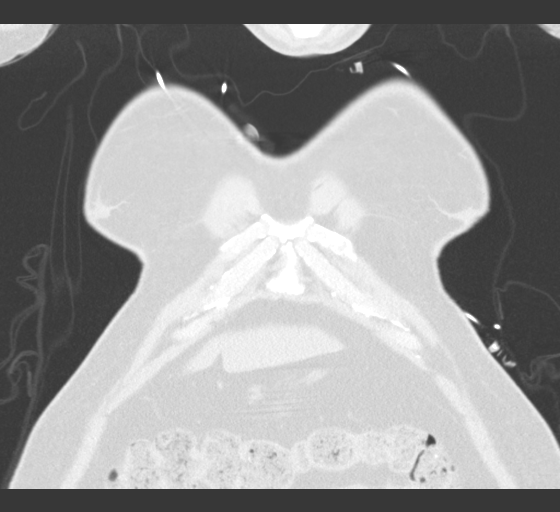
[im 62/155  lung]
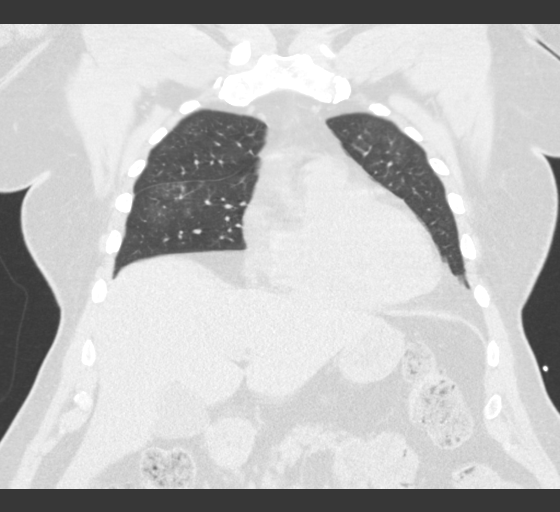
[im 93/155  lung]
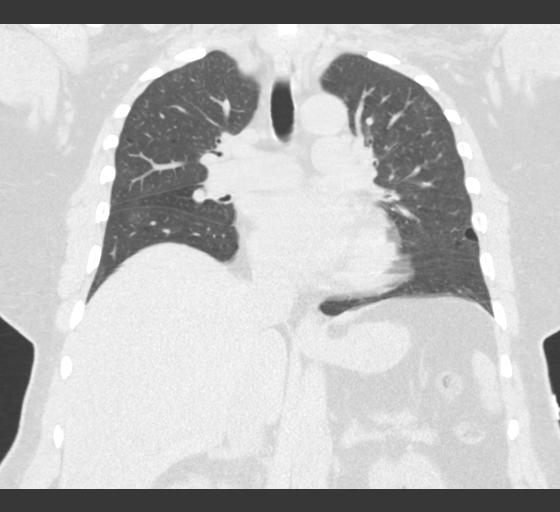

[15 of 36 positions shown; findings below may reference images not displayed]

FINDINGS: Cardiovascular: No significant vascular findings. Normal heart size.
No pericardial effusion.

Mediastinum/Nodes: No enlarged mediastinal or axillary lymph nodes.
Thyroid gland, trachea, and esophagus demonstrate no significant
findings.

Lungs/Pleura: There are few scattered peripheral peribronchovascular
ground-glass opacities in the right lung. Small focal area of
tree-in-bud opacities in the right middle lobe. There is a 6 mm
solid nodule in the left lower lobe (series 7, image 75). There are
two 5 mm nodules in the right lower lobe (series 7, image 56, image
60). Punctate calcified granuloma in the anterior right upper lobe.
No pleural effusion or pneumothorax.

Upper Abdomen: No acute abnormality.  Hepatic steatosis.

Musculoskeletal: No acute or suspicious osseous findings.
IMPRESSION: 1. Few scattered peripheral peribronchovascular ground-glass
opacities in the right lung, with small more focal area of
tree-in-bud opacities in the right middle lobe. Findings are likely
infectious or inflammatory in etiology.
2. Several solid pulmonary nodules in the bilateral lower lobes,
measuring up to 6 mm. Non-contrast chest CT at 3-6 months is
recommended. If the nodules are stable at time of repeat CT, then
future CT at 18-24 months (from today's scan) is considered optional
for low-risk patients, but is recommended for high-risk patients.
This recommendation follows the consensus statement: Guidelines for
Management of Incidental Pulmonary Nodules Detected on CT Images:
3. Hepatic steatosis.

## 2019-01-03 IMAGING — CR DG CHEST 2V
2 series · 2 of 2 positions shown · non-contrast
Comparison: None.

CLINICAL DATA: Cough and fever for 2 months.

EXAM:
CHEST  2 VIEW

[w chest pa]
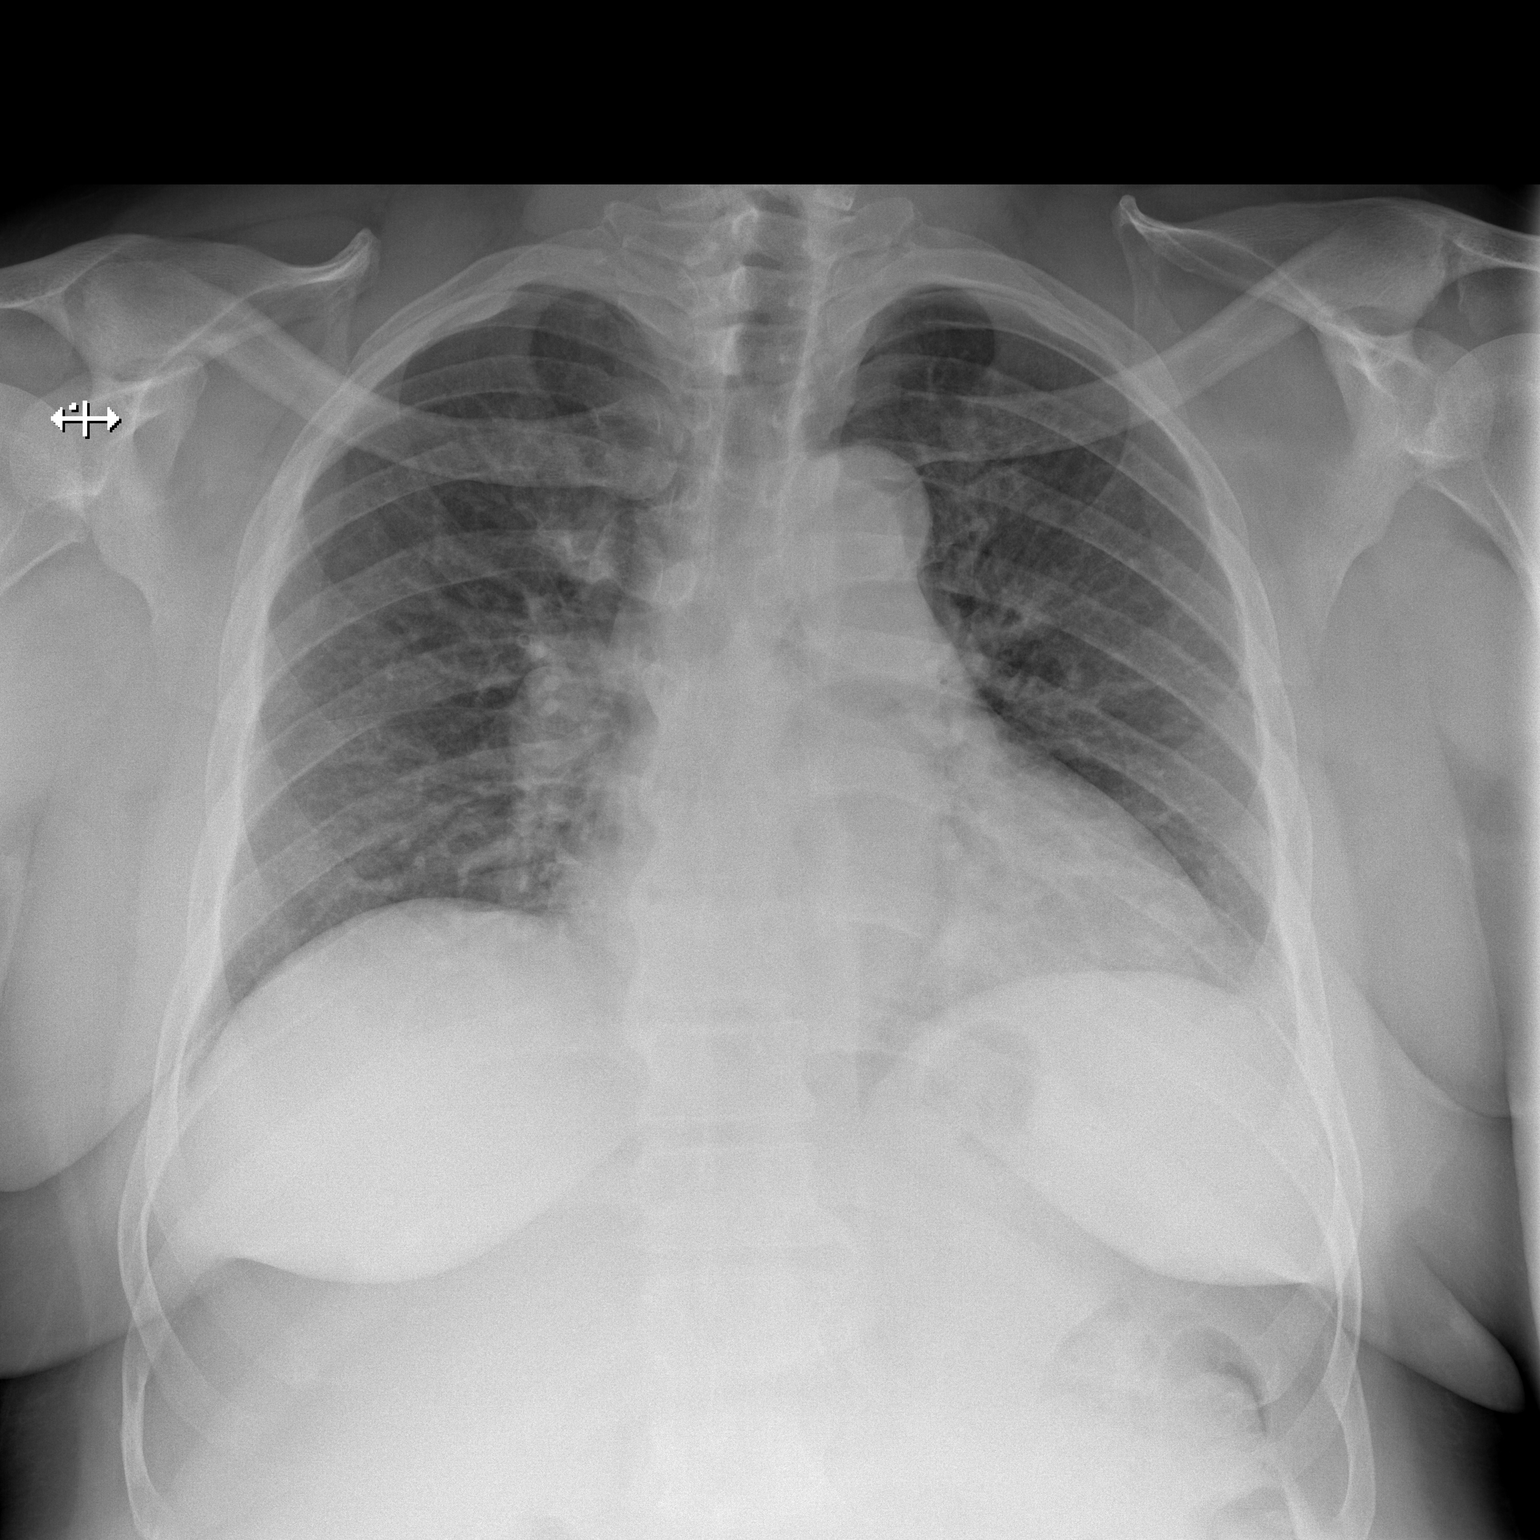

[w chest lat]
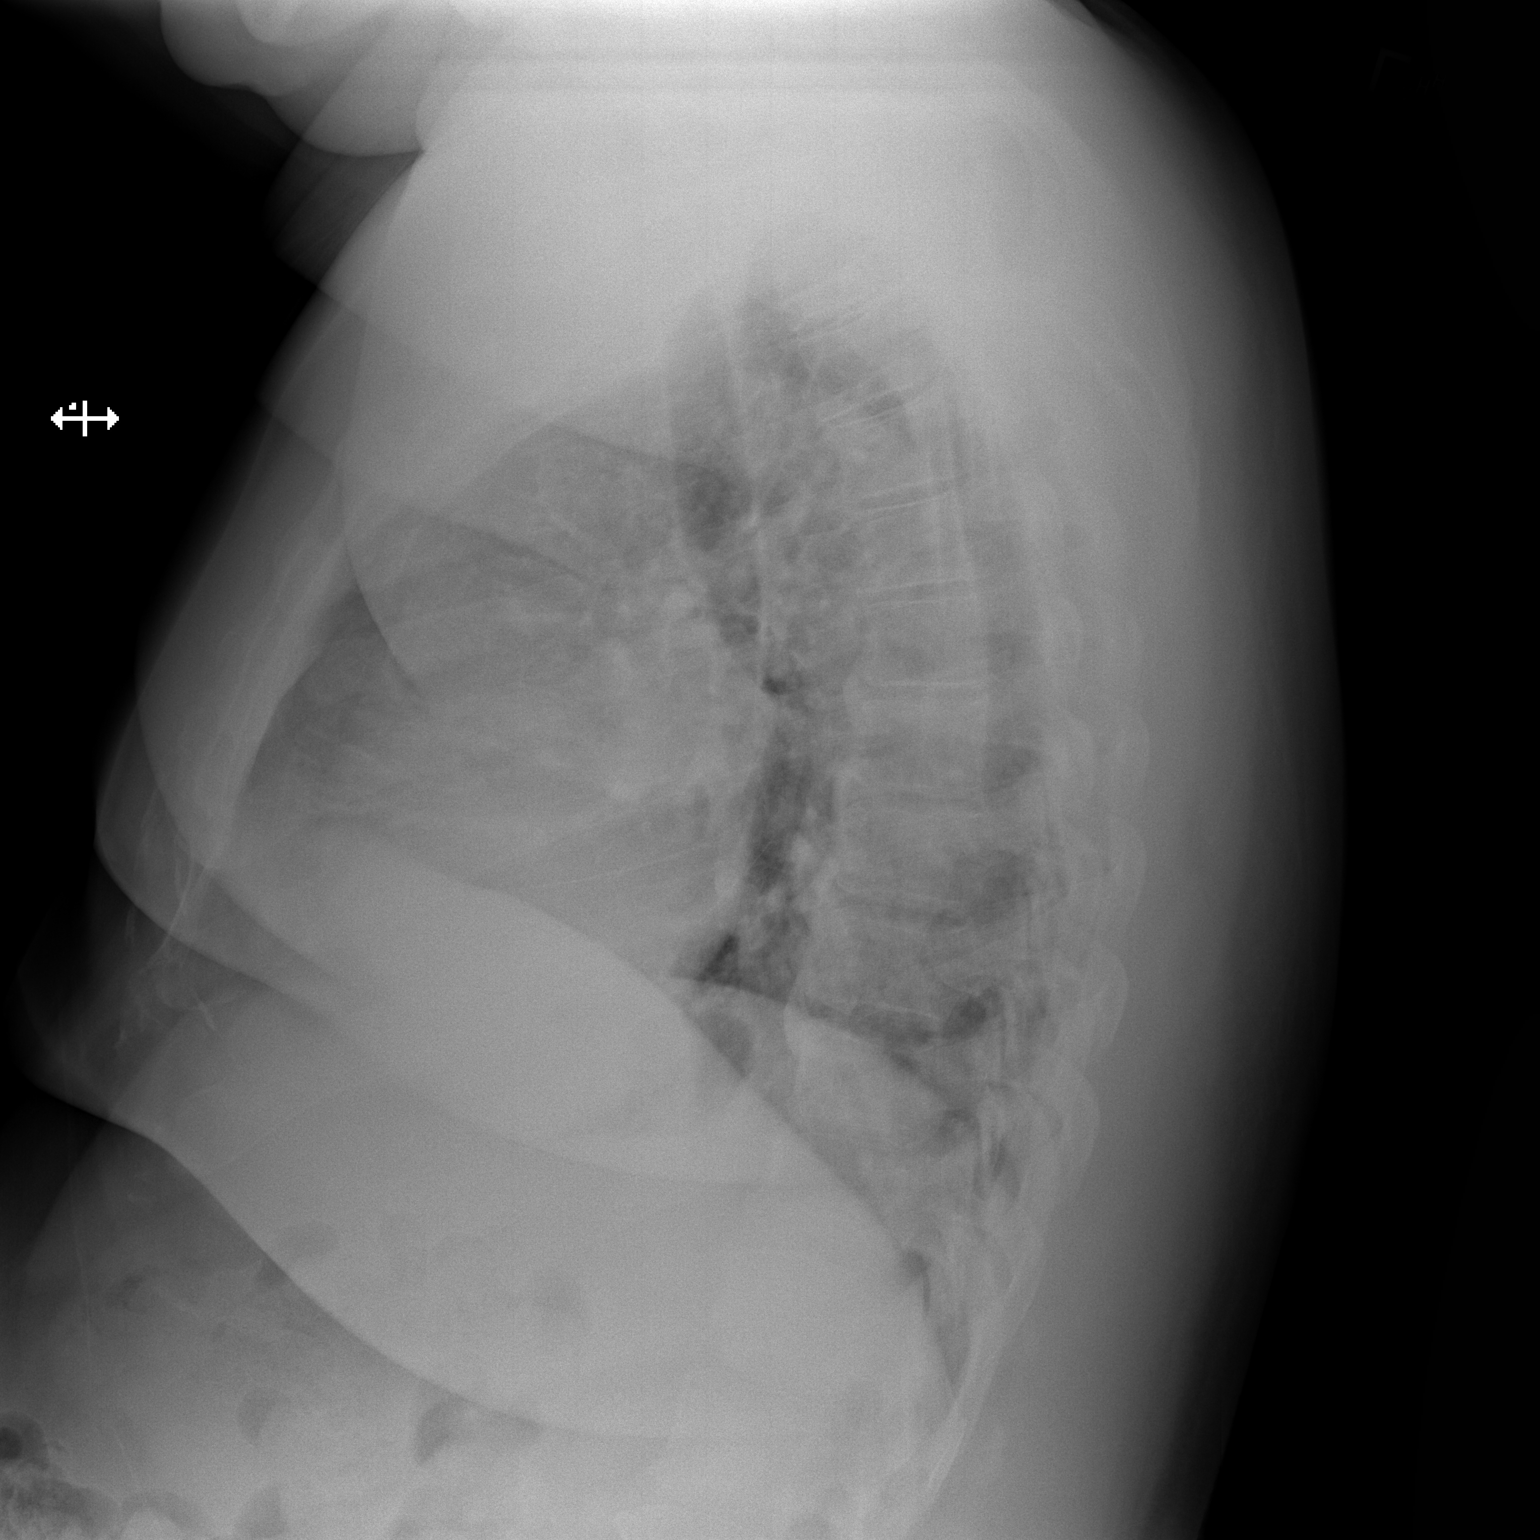

[2 of 2 positions shown; findings below may reference images not displayed]

FINDINGS: The cardiac silhouette is enlarged. Mediastinal contours appear
intact.

There is no evidence of focal airspace consolidation, pleural
effusion or pneumothorax. Mild increase of the interstitial
markings.

Osseous structures are without acute abnormality. Soft tissues are
grossly normal.
IMPRESSION: Enlarged cardiac silhouette.

Mild increase of the interstitial markings usually seen with
interstitial pulmonary edema.
